# Patient Record
Sex: Male | Born: 1955
Health system: Southern US, Community
[De-identification: ages and names within clinical notes are randomized; demographics above are authoritative.]

## PROBLEM LIST (undated history)

## (undated) DIAGNOSIS — T7840XA Allergy, unspecified, initial encounter: Secondary | ICD-10-CM

## (undated) DIAGNOSIS — I10 Essential (primary) hypertension: Secondary | ICD-10-CM

## (undated) DIAGNOSIS — Z974 Presence of external hearing-aid: Secondary | ICD-10-CM

## (undated) HISTORY — PX: TENDON REPAIR: SHX5111

## (undated) HISTORY — DX: Presence of external hearing-aid: Z97.4

## (undated) HISTORY — DX: Essential (primary) hypertension: I10

## (undated) HISTORY — DX: Allergy, unspecified, initial encounter: T78.40XA

## (undated) HISTORY — PX: KNEE SURGERY: SHX244

---

## 2005-01-16 ENCOUNTER — Emergency Department (HOSPITAL_COMMUNITY): Admission: EM | Admit: 2005-01-16 | Discharge: 2005-01-16 | Payer: Self-pay | Admitting: Emergency Medicine

## 2014-04-01 ENCOUNTER — Encounter (HOSPITAL_COMMUNITY): Payer: Self-pay | Admitting: Emergency Medicine

## 2014-04-01 ENCOUNTER — Emergency Department (HOSPITAL_COMMUNITY): Payer: Worker's Compensation

## 2014-04-01 ENCOUNTER — Emergency Department (HOSPITAL_COMMUNITY)
Admission: EM | Admit: 2014-04-01 | Discharge: 2014-04-01 | Disposition: A | Discharge status: Home / Self Care | Payer: Worker's Compensation | Attending: Emergency Medicine | Admitting: Emergency Medicine

## 2014-04-01 DIAGNOSIS — Y9289 Other specified places as the place of occurrence of the external cause: Secondary | ICD-10-CM | POA: Diagnosis not present

## 2014-04-01 DIAGNOSIS — S99919A Unspecified injury of unspecified ankle, initial encounter: Secondary | ICD-10-CM | POA: Diagnosis present

## 2014-04-01 DIAGNOSIS — S99929A Unspecified injury of unspecified foot, initial encounter: Secondary | ICD-10-CM

## 2014-04-01 DIAGNOSIS — Y939 Activity, unspecified: Secondary | ICD-10-CM | POA: Insufficient documentation

## 2014-04-01 DIAGNOSIS — S9031XA Contusion of right foot, initial encounter: Secondary | ICD-10-CM

## 2014-04-01 DIAGNOSIS — W230XXA Caught, crushed, jammed, or pinched between moving objects, initial encounter: Secondary | ICD-10-CM | POA: Diagnosis not present

## 2014-04-01 DIAGNOSIS — Y990 Civilian activity done for income or pay: Secondary | ICD-10-CM | POA: Diagnosis not present

## 2014-04-01 DIAGNOSIS — S9030XA Contusion of unspecified foot, initial encounter: Secondary | ICD-10-CM | POA: Diagnosis not present

## 2014-04-01 DIAGNOSIS — S8990XA Unspecified injury of unspecified lower leg, initial encounter: Secondary | ICD-10-CM | POA: Insufficient documentation

## 2014-04-01 MED ORDER — OXYCODONE-ACETAMINOPHEN 5-325 MG PO TABS: 1.0000 | ORAL_TABLET | ORAL | Status: DC | PRN

## 2014-04-01 MED ORDER — OXYCODONE-ACETAMINOPHEN 5-325 MG PO TABS
1.0000 | ORAL_TABLET | Freq: Once | ORAL | Status: AC
  Administered 2014-04-01: 1 via ORAL
  Filled 2014-04-01: qty 1

## 2014-04-01 NOTE — ED Provider Notes (Signed)
Medical screening examination/treatment/procedure(s) were performed by non-physician practitioner and as supervising physician I was immediately available for consultation/collaboration.   EKG Interpretation None       Orlie Dakin, MD 04/01/14 1625

## 2014-04-01 NOTE — ED Provider Notes (Signed)
CSN: [id number]     Arrival date & time 04/01/14  1257 History  This chart was scribed for a non-physician practitioner, Nehemiah Settle A. Henrietta Hoover, working with Orlie Dakin, MD by Martinique Peace, ED Scribe. The patient was seen in [facility]. The patient's care was started at 1:20 PM.    Chief Complaint  Patient presents with  . Foot Injury      Patient is a 58 y.o. male presenting with foot injury. The history is provided by the patient. No language interpreter was used.  Foot Injury Location:  Foot Time since incident:  1 hour Injury: yes   Mechanism of injury comment:  Forklift accident Foot location:  R foot Pain details:    Severity:  Severe   Onset quality:  Sudden Chronicity:  New Dislocation: no   Foreign body present:  No foreign bodies Prior injury to area:  Yes Relieved by:  Nothing Ineffective treatments:  None tried Associated symptoms: numbness and swelling   Associated symptoms: no decreased ROM, no fever, no muscle weakness and no stiffness   Risk factors: no obesity and no recent illness    HPI Comments: Kenneth Snyder is a 58 y.o. male who presents to the Emergency Department complaining of right foot injury onset 1 hr ago that occurred while he was at work when his foot got trapped between two pallets that weight around 2000 lbs each due to his coworker's poor control of forklift. Pt reports that he was wearing boots when it happened. Pt complains of associated swelling and numbness to affected area. He denies ankle injury or any other injury during incident. Pt further denies DM, being on coumadin or any other problematic health problems.    History reviewed. No pertinent past medical history. Past Surgical History  Procedure Laterality Date  . Tendon repair Left arm   No family history on file. History  Substance Use Topics  . Smoking status: Never Smoker   . Smokeless tobacco: Not on file  . Alcohol Use: Yes    Review of Systems  Constitutional:  Negative for fever and chills.  Gastrointestinal: Negative for nausea, vomiting and diarrhea.  Musculoskeletal: Negative for stiffness.       Right foot pain along proximal and lateral forefoot.   Skin: Negative for color change and wound.  All other systems reviewed and are negative.     Allergies  Review of patient's allergies indicates no known allergies.  Home Medications   Prior to Admission medications   Not on File   BP 135/82  Pulse 77  Temp(Src) 98.1 F (36.7 C) (Oral)  Resp 20  SpO2 100% Physical Exam  Nursing note and vitals reviewed. Constitutional: He is oriented to person, place, and time. He appears well-developed and well-nourished. No distress.  HENT:  Head: Normocephalic and atraumatic.  Eyes: Conjunctivae and EOM are normal.  Neck: Neck supple. No tracheal deviation present.  Cardiovascular: Normal rate.   Pulmonary/Chest: Effort normal. No respiratory distress.  Musculoskeletal: Normal range of motion.  Right foot minimal swelling of proximal and lateral fore foot. No bony prominence or discoloration. Non-tender and stable.   Neurological: He is alert and oriented to person, place, and time.  Distal neuro sensitivity is intact.  Skin: Skin is warm and dry.  Psychiatric: He has a normal mood and affect. His behavior is normal.    ED Course  Procedures (including critical care time) Labs Review Labs Reviewed - No data to display  No results found for this  or any previous visit. No results found.  Dg Foot Complete Right  04/01/2014   CLINICAL DATA:  Traumatic injury and fifth metatarsal pain  EXAM: RIGHT FOOT COMPLETE - 3+ VIEW  COMPARISON:  None.  FINDINGS: There is no evidence of fracture or dislocation. There is no evidence of arthropathy or other focal bone abnormality. Soft tissues are unremarkable.  IMPRESSION: No acute abnormality is noted.   Electronically Signed   By: Inez Catalina M.D.   On: 04/01/2014 14:41   Imaging Review No results  found.   EKG Interpretation None     Medications - No data to display  1:25 PM- Treatment plan was discussed with patient who verbalizes understanding and agrees.   MDM   Final diagnoses:  None    1. Contusion right foot  No bony abnormalities on x-ray, supporting diagnosis of contusion injury. FROM all digits without weakness. Post-op boot and crutches provided.  I personally performed the services described in this documentation, which was scribed in my presence. The recorded information has been reviewed and is accurate.     Dewaine Oats, PA-C 04/01/14 1526

## 2014-04-01 NOTE — Discharge Instructions (Signed)
Cryotherapy °Cryotherapy means treatment with cold. Ice or gel packs can be used to reduce both pain and swelling. Ice is the most helpful within the first 24 to 48 hours after an injury or flare-up from overusing a muscle or joint. Sprains, strains, spasms, burning pain, shooting pain, and aches can all be eased with ice. Ice can also be used when recovering from surgery. Ice is effective, has very few side effects, and is safe for most people to use. °PRECAUTIONS  °Ice is not a safe treatment option for people with: °· Raynaud phenomenon. This is a condition affecting small blood vessels in the extremities. Exposure to cold may cause your problems to return. °· Cold hypersensitivity. There are many forms of cold hypersensitivity, including: °¨ Cold urticaria. Red, itchy hives appear on the skin when the tissues begin to warm after being iced. °¨ Cold erythema. This is a red, itchy rash caused by exposure to cold. °¨ Cold hemoglobinuria. Red blood cells break down when the tissues begin to warm after being iced. The hemoglobin that carry oxygen are passed into the urine because they cannot combine with blood proteins fast enough. °· Numbness or altered sensitivity in the area being iced. °If you have any of the following conditions, do not use ice until you have discussed cryotherapy with your caregiver: °· Heart conditions, such as arrhythmia, angina, or chronic heart disease. °· High blood pressure. °· Healing wounds or open skin in the area being iced. °· Current infections. °· Rheumatoid arthritis. °· Poor circulation. °· Diabetes. °Ice slows the blood flow in the region it is applied. This is beneficial when trying to stop inflamed tissues from spreading irritating chemicals to surrounding tissues. However, if you expose your skin to cold temperatures for too long or without the proper protection, you can damage your skin or nerves. Watch for signs of skin damage due to cold. °HOME CARE INSTRUCTIONS °Follow  these tips to use ice and cold packs safely. °· Place a dry or damp towel between the ice and skin. A damp towel will cool the skin more quickly, so you may need to shorten the time that the ice is used. °· For a more rapid response, add gentle compression to the ice. °· Ice for no more than 10 to 20 minutes at a time. The bonier the area you are icing, the less time it will take to get the benefits of ice. °· Check your skin after 5 minutes to make sure there are no signs of a poor response to cold or skin damage. °· Rest 20 minutes or more between uses. °· Once your skin is numb, you can end your treatment. You can test numbness by very lightly touching your skin. The touch should be so light that you do not see the skin dimple from the pressure of your fingertip. When using ice, most people will feel these normal sensations in this order: cold, burning, aching, and numbness. °· Do not use ice on someone who cannot communicate their responses to pain, such as small children or people with dementia. °HOW TO MAKE AN ICE PACK °Ice packs are the most common way to use ice therapy. Other methods include ice massage, ice baths, and cryosprays. Muscle creams that cause a cold, tingly feeling do not offer the same benefits that ice offers and should not be used as a substitute unless recommended by your caregiver. °To make an ice pack, do one of the following: °· Place crushed ice or a   bag of frozen vegetables in a sealable plastic bag. Squeeze out the excess air. Place this bag inside another plastic bag. Slide the bag into a pillowcase or place a damp towel between your skin and the bag.  Mix 3 parts water with 1 part rubbing alcohol. Freeze the mixture in a sealable plastic bag. When you remove the mixture from the freezer, it will be slushy. Squeeze out the excess air. Place this bag inside another plastic bag. Slide the bag into a pillowcase or place a damp towel between your skin and the bag. SEEK MEDICAL CARE  IF:  You develop white spots on your skin. This may give the skin a blotchy (mottled) appearance.  Your skin turns blue or pale.  Your skin becomes waxy or hard.  Your swelling gets worse. MAKE SURE YOU:   Understand these instructions.  Will watch your condition.  Will get help right away if you are not doing well or get worse. Document Released: 03/17/2011 Document Revised: 12/05/2013 Document Reviewed: 03/17/2011 [facility] Patient Information 2015 [place], [place]. This information is not intended to replace advice given to you by your health care provider. Make sure you discuss any questions you have with your health care provider.  Contusion A contusion is a deep bruise. Contusions happen when an injury causes bleeding under the skin. Signs of bruising include pain, puffiness (swelling), and discolored skin. The contusion may turn blue, purple, or yellow. HOME CARE   Put ice on the injured area.  Put ice in a plastic bag.  Place a towel between your skin and the bag.  Leave the ice on for 15-20 minutes, 03-04 times a day.  Only take medicine as told by your doctor.  Rest the injured area.  If possible, raise (elevate) the injured area to lessen puffiness. GET HELP RIGHT AWAY IF:   You have more bruising or puffiness.  You have pain that is getting worse.  Your puffiness or pain is not helped by medicine. MAKE SURE YOU:   Understand these instructions.  Will watch your condition.  Will get help right away if you are not doing well or get worse. Document Released: 01/07/2008 Document Revised: 10/13/2011 Document Reviewed: 05/26/2011 [facility] Patient Information 2015 [place], [place]. This information is not intended to replace advice given to you by your health care provider. Make sure you discuss any questions you have with your health care provider.

## 2014-04-01 NOTE — ED Notes (Addendum)
Pt from home c/o R foot pain after it was trapped between two pallets that weigh approx 2000 lbs.each approx 1 hr PTA. Pt denies ankle pain. Pt has swelling to top of R foot. Pt denies other injury. Pt is A&O and in NAD. Pt wife at bedside. Pt can wiggle toes, cap refill <3, moderate pulse palpated  And has good color. Pt moves ankle w/o difficulty or pain

## 2015-08-29 ENCOUNTER — Encounter (HOSPITAL_COMMUNITY): Payer: Self-pay

## 2015-08-29 ENCOUNTER — Emergency Department (HOSPITAL_COMMUNITY)
Admission: EM | Admit: 2015-08-29 | Discharge: 2015-08-29 | Disposition: A | Discharge status: Home / Self Care | Payer: Worker's Compensation | Attending: Emergency Medicine | Admitting: Emergency Medicine

## 2015-08-29 ENCOUNTER — Emergency Department (HOSPITAL_COMMUNITY): Payer: Worker's Compensation

## 2015-08-29 DIAGNOSIS — Z7951 Long term (current) use of inhaled steroids: Secondary | ICD-10-CM | POA: Diagnosis not present

## 2015-08-29 DIAGNOSIS — Y9389 Activity, other specified: Secondary | ICD-10-CM | POA: Diagnosis not present

## 2015-08-29 DIAGNOSIS — S0990XA Unspecified injury of head, initial encounter: Secondary | ICD-10-CM | POA: Diagnosis present

## 2015-08-29 DIAGNOSIS — S060X1A Concussion with loss of consciousness of 30 minutes or less, initial encounter: Secondary | ICD-10-CM | POA: Insufficient documentation

## 2015-08-29 DIAGNOSIS — Y990 Civilian activity done for income or pay: Secondary | ICD-10-CM | POA: Diagnosis not present

## 2015-08-29 DIAGNOSIS — R55 Syncope and collapse: Secondary | ICD-10-CM | POA: Diagnosis not present

## 2015-08-29 DIAGNOSIS — Y9289 Other specified places as the place of occurrence of the external cause: Secondary | ICD-10-CM | POA: Diagnosis not present

## 2015-08-29 DIAGNOSIS — W1809XA Striking against other object with subsequent fall, initial encounter: Secondary | ICD-10-CM | POA: Insufficient documentation

## 2015-08-29 NOTE — ED Notes (Signed)
ED PA at bedside

## 2015-08-29 NOTE — Discharge Instructions (Signed)
Concussion, Adult A concussion, or closed-head injury, is a brain injury caused by a direct blow to the head or by a quick and sudden movement (jolt) of the head or neck. Concussions are usually not life-threatening. Even so, the effects of a concussion can be serious. If you have had a concussion before, you are more likely to experience concussion-like symptoms after a direct blow to the head.  CAUSES  Direct blow to the head, such as from running into another player during a soccer game, being hit in a fight, or hitting your head on a hard surface.  A jolt of the head or neck that causes the brain to move back and forth inside the skull, such as in a car crash. SIGNS AND SYMPTOMS The signs of a concussion can be hard to notice. Early on, they may be missed by you, family members, and health care providers. You may look fine but act or feel differently. Symptoms are usually temporary, but they may last for days, weeks, or even longer. Some symptoms may appear right away while others may not show up for hours or days. Every head injury is different. Symptoms include:  Mild to moderate headaches that will not go away.  A feeling of pressure inside your head.  Having more trouble than usual:  Learning or remembering things you have heard.  Answering questions.  Paying attention or concentrating.  Organizing daily tasks.  Making decisions and solving problems.  Slowness in thinking, acting or reacting, speaking, or reading.  Getting lost or being easily confused.  Feeling tired all the time or lacking energy (fatigued).  Feeling drowsy.  Sleep disturbances.  Sleeping more than usual.  Sleeping less than usual.  Trouble falling asleep.  Trouble sleeping (insomnia).  Loss of balance or feeling lightheaded or dizzy.  Nausea or vomiting.  Numbness or tingling.  Increased sensitivity to:  Sounds.  Lights.  Distractions.  Vision problems or eyes that tire  easily.  Diminished sense of taste or smell.  Ringing in the ears.  Mood changes such as feeling sad or anxious.  Becoming easily irritated or angry for little or no reason.  Lack of motivation.  Seeing or hearing things other people do not see or hear (hallucinations). DIAGNOSIS Your health care provider can usually diagnose a concussion based on a description of your injury and symptoms. He or she will ask whether you passed out (lost consciousness) and whether you are having trouble remembering events that happened right before and during your injury. Your evaluation might include:  A brain scan to look for signs of injury to the brain. Even if the test shows no injury, you may still have a concussion.  Blood tests to be sure other problems are not present. TREATMENT  Concussions are usually treated in an emergency department, in urgent care, or at a clinic. You may need to stay in the hospital overnight for further treatment.  Tell your health care provider if you are taking any medicines, including prescription medicines, over-the-counter medicines, and natural remedies. Some medicines, such as blood thinners (anticoagulants) and aspirin, may increase the chance of complications. Also tell your health care provider whether you have had alcohol or are taking illegal drugs. This information may affect treatment.  Your health care provider will send you home with important instructions to follow.  How fast you will recover from a concussion depends on many factors. These factors include how severe your concussion is, what part of your brain was injured,  your age, and how healthy you were before the concussion.  Most people with mild injuries recover fully. Recovery can take time. In general, recovery is slower in older persons. Also, persons who have had a concussion in the past or have other medical problems may find that it takes longer to recover from their current injury. HOME  CARE INSTRUCTIONS General Instructions  Carefully follow the directions your health care provider gave you.  Only take over-the-counter or prescription medicines for pain, discomfort, or fever as directed by your health care provider.  Take only those medicines that your health care provider has approved.  Do not drink alcohol until your health care provider says you are well enough to do so. Alcohol and certain other drugs may slow your recovery and can put you at risk of further injury.  If it is harder than usual to remember things, write them down.  If you are easily distracted, try to do one thing at a time. For example, do not try to watch TV while fixing dinner.  Talk with family members or close friends when making important decisions.  Keep all follow-up appointments. Repeated evaluation of your symptoms is recommended for your recovery.  Watch your symptoms and tell others to do the same. Complications sometimes occur after a concussion. Older adults with a brain injury may have a higher risk of serious complications, such as a blood clot on the brain.  Tell your teachers, school nurse, school counselor, coach, athletic trainer, or work Freight forwarder about your injury, symptoms, and restrictions. Tell them about what you can or cannot do. They should watch for:  Increased problems with attention or concentration.  Increased difficulty remembering or learning new information.  Increased time needed to complete tasks or assignments.  Increased irritability or decreased ability to cope with stress.  Increased symptoms.  Rest. Rest helps the brain to heal. Make sure you:  Get plenty of sleep at night. Avoid staying up late at night.  Keep the same bedtime hours on weekends and weekdays.  Rest during the day. Take daytime naps or rest breaks when you feel tired.  Limit activities that require a lot of thought or concentration. These include:  Doing homework or job-related  work.  Watching TV.  Working on the computer.  Avoid any situation where there is potential for another head injury (football, hockey, soccer, basketball, martial arts, downhill snow sports and horseback riding). Your condition will get worse every time you experience a concussion. You should avoid these activities until you are evaluated by the appropriate follow-up health care providers. Returning To Your Regular Activities You will need to return to your normal activities slowly, not all at once. You must give your body and brain enough time for recovery.  Do not return to sports or other athletic activities until your health care provider tells you it is safe to do so.  Ask your health care provider when you can drive, ride a bicycle, or operate heavy machinery. Your ability to react may be slower after a brain injury. Never do these activities if you are dizzy.  Ask your health care provider about when you can return to work or school. Preventing Another Concussion It is very important to avoid another brain injury, especially before you have recovered. In rare cases, another injury can lead to permanent brain damage, brain swelling, or death. The risk of this is greatest during the first 7-10 days after a head injury. Avoid injuries by:  Wearing a  seat belt when riding in a car.  Drinking alcohol only in moderation.  Wearing a helmet when biking, skiing, skateboarding, skating, or doing similar activities.  Avoiding activities that could lead to a second concussion, such as contact or recreational sports, until your health care provider says it is okay.  Taking safety measures in your home.  Remove clutter and tripping hazards from floors and stairways.  Use grab bars in bathrooms and handrails by stairs.  Place non-slip mats on floors and in bathtubs.  Improve lighting in dim areas. SEEK MEDICAL CARE IF:  You have increased problems paying attention or  concentrating.  You have increased difficulty remembering or learning new information.  You need more time to complete tasks or assignments than before.  You have increased irritability or decreased ability to cope with stress.  You have more symptoms than before. Seek medical care if you have any of the following symptoms for more than 2 weeks after your injury:  Lasting (chronic) headaches.  Dizziness or balance problems.  Nausea.  Vision problems.  Increased sensitivity to noise or light.  Depression or mood swings.  Anxiety or irritability.  Memory problems.  Difficulty concentrating or paying attention.  Sleep problems.  Feeling tired all the time. SEEK IMMEDIATE MEDICAL CARE IF:  You have severe or worsening headaches. These may be a sign of a blood clot in the brain.  You have weakness (even if only in one hand, leg, or part of the face).  You have numbness.  You have decreased coordination.  You vomit repeatedly.  You have increased sleepiness.  One pupil is larger than the other.  You have convulsions.  You have slurred speech.  You have increased confusion. This may be a sign of a blood clot in the brain.  You have increased restlessness, agitation, or irritability.  You are unable to recognize people or places.  You have neck pain.  It is difficult to wake you up.  You have unusual behavior changes.  You lose consciousness. MAKE SURE YOU:  Understand these instructions.  Will watch your condition.  Will get help right away if you are not doing well or get worse.   This information is not intended to replace advice given to you by your health care provider. Make sure you discuss any questions you have with your health care provider.   Document Released: 10/11/2003 Document Revised: 08/11/2014 Document Reviewed: 02/10/2013 Elsevier Interactive Patient Education 2016 [employer]  Post-Concussion Syndrome Post-concussion  syndrome describes the symptoms that can occur after a head injury. These symptoms can last from weeks to months. CAUSES  It is not clear why some head injuries cause post-concussion syndrome. It can occur whether your head injury was mild or severe and whether you were wearing head protection or not.  SIGNS AND SYMPTOMS  Memory difficulties.  Dizziness.  Headaches.  Double vision or blurry vision.  Sensitivity to light.  Hearing difficulties.  Depression.  Tiredness.  Weakness.  Difficulty with concentration.  Difficulty sleeping or staying asleep.  Vomiting.  Poor balance or instability on your feet.  Slow reaction time.  Difficulty learning and remembering things you have heard. DIAGNOSIS  There is no test to determine whether you have post-concussion syndrome. Your health care provider may order an imaging scan of your brain, such as a CT scan, to check for other problems that may be causing your symptoms (such as a severe injury inside your skull). TREATMENT  Usually, these problems disappear over time  without medical care. Your health care provider may prescribe medicine to help ease your symptoms. It is important to follow up with a neurologist to evaluate your recovery and address any lingering symptoms or issues. HOME CARE INSTRUCTIONS   Take medicines only as directed by your health care provider. Do not take aspirin. Aspirin can slow blood clotting.  Sleep with your head slightly elevated to help with headaches.  Avoid any situation where there is potential for another head injury. This includes football, hockey, soccer, basketball, martial arts, downhill snow sports, and horseback riding. Your condition will get worse every time you experience a concussion. You should avoid these activities until you are evaluated by the appropriate follow-up health care providers.  Keep all follow-up visits as directed by your health care provider. This is important. SEEK  MEDICAL CARE IF:  You have increased problems paying attention or concentrating.  You have increased difficulty remembering or learning new information.  You need more time to complete tasks or assignments than before.  You have increased irritability or decreased ability to cope with stress.  You have more symptoms than before. Seek medical care if you have any of the following symptoms for more than two weeks after your injury:  Lasting (chronic) headaches.  Dizziness or balance problems.  Nausea.  Vision problems.  Increased sensitivity to noise or light.  Depression or mood swings.  Anxiety or irritability.  Memory problems.  Difficulty concentrating or paying attention.  Sleep problems.  Feeling tired all the time. SEEK IMMEDIATE MEDICAL CARE IF:  You have confusion or unusual drowsiness.  Others find it difficult to wake you up.  You have nausea or persistent, forceful vomiting.  You feel like you are moving when you are not (vertigo). Your eyes may move rapidly back and forth.  You have convulsions or faint.  You have severe, persistent headaches that are not relieved by medicine.  You cannot use your arms or legs normally.  One of your pupils is larger than the other.  You have clear or bloody discharge from your nose or ears.  Your problems are getting worse, not better. MAKE SURE YOU:  Understand these instructions.  Will watch your condition.  Will get help right away if you are not doing well or get worse.   This information is not intended to replace advice given to you by your health care provider. Make sure you discuss any questions you have with your health care provider.   Document Released: 01/10/2002 Document Revised: 08/11/2014 Document Reviewed: 10/26/2013 Elsevier Interactive Patient Education [employer].

## 2015-08-29 NOTE — ED Provider Notes (Signed)
CSN: [id number]     Arrival date & time 08/29/15  1227 History   First MD Initiated Contact with Patient 08/29/15 1505     Chief Complaint  Patient presents with  . Head Injury     (Consider location/radiation/quality/duration/timing/severity/associated sxs/prior Treatment) HPI Comments: Patient presents to the ED with a chief complaint of head injury.  Patient states that he hit his head on a steel beam today at work.  He hit the back of his head. States that it caused him to pass out and he fell to the the ground.  He reports associated nausea, but no vomiting.  States that he had tingling throughout his entire body, but not in any particular region.  He states that he has had some difficulty remembering things since the accident and has been asking questions repeatedly.  He states that he has had some difficulty balancing.  He denies vision changes, weakness, or numbness.  He does not take any anticoagulants.  He denies any other injuries.  He states that his symptoms are improving.  The history is provided by the patient. No language interpreter was used.    History reviewed. No pertinent past medical history. Past Surgical History  Procedure Laterality Date  . Tendon repair Left arm   No family history on file. Social History  Substance Use Topics  . Smoking status: Never Smoker   . Smokeless tobacco: None  . Alcohol Use: Yes    Review of Systems  Constitutional: Negative for fever and chills.  Respiratory: Negative for shortness of breath.   Cardiovascular: Negative for chest pain.  Gastrointestinal: Negative for nausea, vomiting, diarrhea and constipation.  Genitourinary: Negative for dysuria.  Neurological: Positive for syncope and headaches.  All other systems reviewed and are negative.     Allergies  Review of patient's allergies indicates no known allergies.  Home Medications   Prior to Admission medications   Medication Sig Start Date End Date Taking?  Authorizing Provider  budesonide (RHINOCORT ALLERGY) 32 MCG/ACT nasal spray Place 2 sprays into both nostrils daily as needed for rhinitis or allergies.   Yes Historical Provider, MD  ranitidine (ZANTAC) 150 MG tablet Take 150 mg by mouth 2 (two) times daily as needed for heartburn.   Yes Historical Provider, MD  fexofenadine (ALLEGRA) 180 MG tablet Take 180 mg by mouth daily as needed for allergies.     Historical Provider, MD  oxyCODONE-acetaminophen (PERCOCET/ROXICET) 5-325 MG per tablet Take 1 tablet by mouth every 4 (four) hours as needed for severe pain. Patient not taking: Reported on 08/29/2015 04/01/14   Charlann Lange, PA-C   BP 148/93 mmHg  Pulse 66  Temp(Src) 97.8 F (36.6 C) (Oral)  Resp 16  SpO2 99% Physical Exam  Constitutional: He is oriented to person, place, and time. He appears well-developed and well-nourished.  HENT:  Head: Normocephalic and atraumatic.  Right Ear: External ear normal.  Left Ear: External ear normal.  Eyes: Conjunctivae and EOM are normal. Pupils are equal, round, and reactive to light.  Neck: Normal range of motion. Neck supple.  No pain with neck flexion, no meningismus  Cardiovascular: Normal rate, regular rhythm and normal heart sounds.  Exam reveals no gallop and no friction rub.   No murmur heard. Pulmonary/Chest: Effort normal and breath sounds normal. No respiratory distress. He has no wheezes. He has no rales. He exhibits no tenderness.  Abdominal: Soft. He exhibits no distension and no mass. There is no tenderness. There is no rebound and no  guarding.  Musculoskeletal: Normal range of motion. He exhibits no edema or tenderness.  Normal gait.  Neurological: He is alert and oriented to person, place, and time. He has normal reflexes.  CN 3-12 intact, normal finger to nose, no pronator drift, sensation and strength intact bilaterally.  Skin: Skin is warm and dry.  Psychiatric: He has a normal mood and affect. His behavior is normal. Judgment  and thought content normal.  Nursing note and vitals reviewed.   ED Course  Procedures (including critical care time)  Imaging Review Ct Head Wo Contrast  08/29/2015  CLINICAL DATA:  Recent blunt trauma to the head with apparent loss of consciousness EXAM: CT HEAD WITHOUT CONTRAST TECHNIQUE: Contiguous axial images were obtained from the base of the skull through the vertex without intravenous contrast. COMPARISON:  None. FINDINGS: The bony calvarium is intact. No findings to suggest acute hemorrhage, acute infarction or space-occupying mass lesion are noted. IMPRESSION: No acute abnormality noted. Electronically Signed   By: Inez Catalina M.D.   On: 08/29/2015 15:43   I have personally reviewed and evaluated these images and lab results as part of my medical decision-making.   MDM   Final diagnoses:  Concussion, with loss of consciousness of 30 minutes or less, initial encounter    Patient with head injury.  Suspected LOC.  No vomiting.  Will check head CT.  Likely concussion.  Neurovascularly intact.  4:19 PM CT is negative.  Patient continues to feel better.  Recommend close follow-up with PCP or neurology for concussion.  Patient drives truck and fork lift.  Advised work restrictions until seen in follow-up.    Eldridge Rielly, PA-C 08/29/15 [place], MD 08/30/15 [phone]

## 2015-08-29 NOTE — ED Notes (Signed)
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  He states he feels nauseated, but hasn't vomited.  He further states he is having balance difficulty and is having trouble ambulating.  He states he was struck at right upper occipital area.

## 2015-09-05 ENCOUNTER — Ambulatory Visit (INDEPENDENT_AMBULATORY_CARE_PROVIDER_SITE_OTHER): Payer: Worker's Compensation | Admitting: Family Medicine

## 2015-09-05 VITALS — BP 128/80 | HR 70 | Temp 97.7°F | Resp 19 | Ht 70.5 in | Wt 236.4 lb

## 2015-09-05 DIAGNOSIS — G44309 Post-traumatic headache, unspecified, not intractable: Secondary | ICD-10-CM

## 2015-09-05 DIAGNOSIS — R2689 Other abnormalities of gait and mobility: Secondary | ICD-10-CM

## 2015-09-05 DIAGNOSIS — S0990XS Unspecified injury of head, sequela: Secondary | ICD-10-CM | POA: Diagnosis not present

## 2015-09-05 DIAGNOSIS — R2681 Unsteadiness on feet: Secondary | ICD-10-CM

## 2015-09-05 DIAGNOSIS — S060X9A Concussion with loss of consciousness of unspecified duration, initial encounter: Secondary | ICD-10-CM

## 2015-09-05 NOTE — Progress Notes (Signed)
Patient ID: SUHEB BERMUDES, male    DOB: [birth year]  Age: 60 y.o. MRN: [id number]  Chief Complaint  Patient presents with  . Headache    W/C  . Concussion    Subjective:   60 year old man who had an accident at work on January 25. He picked up and slowing of 50 pound bag of fertilizer, and as he rotated his body his head struck a steel bar. He was transiently knocked out, probably just a few seconds and got himself up in a very staggered fashion.    A coworker came and helped him.they called [facility] and had a CT scan of his head and was diagnosed with having had a concussion. He has been off of work, doing very little, minimizing brain use and screen use.He was a little unsteady in his gait, but that has steadily improved He feels like he is almost but not quite back to normal. When he stands still for a while his balance is not quite as good as it should be. His gait is good.  Current allergies, medications, problem list, past/family and social histories reviewed.  Objective:  BP 128/80 mmHg  Pulse 70  Temp(Src) 97.7 F (36.5 C) (Oral)  Resp 19  Ht 5' 10.5" (1.791 m)  Wt 236 lb 6.4 oz (107.23 kg)  BMI 33.43 kg/m2  SpO2 97%  Alert and oriented in no acu distress. His TMs are normal. Eyes PERRLA. EOMs intact. Fundi benign. Has a dark spot in the left lens, sees an eye doctor. His neck is supple without nodes. Chest clear. Heart regular without murmur. Cranial nerves II-12 grossly intact.Strength symmetrical. Romberg is a little unsteady.Finger-nose fair but not extremely rapid Right better than left.Tandem walk was satisfactory.  Assessment & Plan:   Assessment: No diagnosis found.    Plan: Off work for another week to reassess. Continue to not over strain his brain.  No orders of the defined types were placed in this encounter.    No orders of the defined types were placed in this encounter.          There are no Patient Instructions on file for this visit.   No Follow-up on file.   HOPPER,DAVID, MD 09/05/2015

## 2015-09-05 NOTE — Patient Instructions (Addendum)
Continue light activity only  Continue minimizing screen use  Return at any time if abruptly worse  Return in one week  Concussion, Adult A concussion, or closed-head injury, is a brain injury caused by a direct blow to the head or by a quick and sudden movement (jolt) of the head or neck. Concussions are usually not life-threatening. Even so, the effects of a concussion can be serious. If you have had a concussion before, you are more likely to experience concussion-like symptoms after a direct blow to the head.  CAUSES  Direct blow to the head, such as from running into another player during a soccer game, being hit in a fight, or hitting your head on a hard surface.  A jolt of the head or neck that causes the brain to move back and forth inside the skull, such as in a car crash. SIGNS AND SYMPTOMS The signs of a concussion can be hard to notice. Early on, they may be missed by you, family members, and health care providers. You may look fine but act or feel differently. Symptoms are usually temporary, but they may last for days, weeks, or even longer. Some symptoms may appear right away while others may not show up for hours or days. Every head injury is different. Symptoms include:  Mild to moderate headaches that will not go away.  A feeling of pressure inside your head.  Having more trouble than usual:  Learning or remembering things you have heard.  Answering questions.  Paying attention or concentrating.  Organizing daily tasks.  Making decisions and solving problems.  Slowness in thinking, acting or reacting, speaking, or reading.  Getting lost or being easily confused.  Feeling tired all the time or lacking energy (fatigued).  Feeling drowsy.  Sleep disturbances.  Sleeping more than usual.  Sleeping less than usual.  Trouble falling asleep.  Trouble sleeping (insomnia).  Loss of balance or feeling lightheaded or dizzy.  Nausea or vomiting.  Numbness  or tingling.  Increased sensitivity to:  Sounds.  Lights.  Distractions.  Vision problems or eyes that tire easily.  Diminished sense of taste or smell.  Ringing in the ears.  Mood changes such as feeling sad or anxious.  Becoming easily irritated or angry for little or no reason.  Lack of motivation.  Seeing or hearing things other people do not see or hear (hallucinations). DIAGNOSIS Your health care provider can usually diagnose a concussion based on a description of your injury and symptoms. He or she will ask whether you passed out (lost consciousness) and whether you are having trouble remembering events that happened right before and during your injury. Your evaluation might include:  A brain scan to look for signs of injury to the brain. Even if the test shows no injury, you may still have a concussion.  Blood tests to be sure other problems are not present. TREATMENT  Concussions are usually treated in an emergency department, in urgent care, or at a clinic. You may need to stay in the hospital overnight for further treatment.  Tell your health care provider if you are taking any medicines, including prescription medicines, over-the-counter medicines, and natural remedies. Some medicines, such as blood thinners (anticoagulants) and aspirin, may increase the chance of complications. Also tell your health care provider whether you have had alcohol or are taking illegal drugs. This information may affect treatment.  Your health care provider will send you home with important instructions to follow.  How fast you will  recover from a concussion depends on many factors. These factors include how severe your concussion is, what part of your brain was injured, your age, and how healthy you were before the concussion.  Most people with mild injuries recover fully. Recovery can take time. In general, recovery is slower in older persons. Also, persons who have had a concussion  in the past or have other medical problems may find that it takes longer to recover from their current injury. HOME CARE INSTRUCTIONS General Instructions  Carefully follow the directions your health care provider gave you.  Only take over-the-counter or prescription medicines for pain, discomfort, or fever as directed by your health care provider.  Take only those medicines that your health care provider has approved.  Do not drink alcohol until your health care provider says you are well enough to do so. Alcohol and certain other drugs may slow your recovery and can put you at risk of further injury.  If it is harder than usual to remember things, write them down.  If you are easily distracted, try to do one thing at a time. For example, do not try to watch TV while fixing dinner.  Talk with family members or close friends when making important decisions.  Keep all follow-up appointments. Repeated evaluation of your symptoms is recommended for your recovery.  Watch your symptoms and tell others to do the same. Complications sometimes occur after a concussion. Older adults with a brain injury may have a higher risk of serious complications, such as a blood clot on the brain.  Tell your teachers, school nurse, school counselor, coach, athletic trainer, or work Freight forwarder about your injury, symptoms, and restrictions. Tell them about what you can or cannot do. They should watch for:  Increased problems with attention or concentration.  Increased difficulty remembering or learning new information.  Increased time needed to complete tasks or assignments.  Increased irritability or decreased ability to cope with stress.  Increased symptoms.  Rest. Rest helps the brain to heal. Make sure you:  Get plenty of sleep at night. Avoid staying up late at night.  Keep the same bedtime hours on weekends and weekdays.  Rest during the day. Take daytime naps or rest breaks when you feel  tired.  Limit activities that require a lot of thought or concentration. These include:  Doing homework or job-related work.  Watching TV.  Working on the computer.  Avoid any situation where there is potential for another head injury (football, hockey, soccer, basketball, martial arts, downhill snow sports and horseback riding). Your condition will get worse every time you experience a concussion. You should avoid these activities until you are evaluated by the appropriate follow-up health care providers. Returning To Your Regular Activities You will need to return to your normal activities slowly, not all at once. You must give your body and brain enough time for recovery.  Do not return to sports or other athletic activities until your health care provider tells you it is safe to do so.  Ask your health care provider when you can drive, ride a bicycle, or operate heavy machinery. Your ability to react may be slower after a brain injury. Never do these activities if you are dizzy.  Ask your health care provider about when you can return to work or school. Preventing Another Concussion It is very important to avoid another brain injury, especially before you have recovered. In rare cases, another injury can lead to permanent brain damage, brain swelling,  or death. The risk of this is greatest during the first 7-10 days after a head injury. Avoid injuries by:  Wearing a seat belt when riding in a car.  Drinking alcohol only in moderation.  Wearing a helmet when biking, skiing, skateboarding, skating, or doing similar activities.  Avoiding activities that could lead to a second concussion, such as contact or recreational sports, until your health care provider says it is okay.  Taking safety measures in your home.  Remove clutter and tripping hazards from floors and stairways.  Use grab bars in bathrooms and handrails by stairs.  Place non-slip mats on floors and in  bathtubs.  Improve lighting in dim areas. SEEK MEDICAL CARE IF:  You have increased problems paying attention or concentrating.  You have increased difficulty remembering or learning new information.  You need more time to complete tasks or assignments than before.  You have increased irritability or decreased ability to cope with stress.  You have more symptoms than before. Seek medical care if you have any of the following symptoms for more than 2 weeks after your injury:  Lasting (chronic) headaches.  Dizziness or balance problems.  Nausea.  Vision problems.  Increased sensitivity to noise or light.  Depression or mood swings.  Anxiety or irritability.  Memory problems.  Difficulty concentrating or paying attention.  Sleep problems.  Feeling tired all the time. SEEK IMMEDIATE MEDICAL CARE IF:  You have severe or worsening headaches. These may be a sign of a blood clot in the brain.  You have weakness (even if only in one hand, leg, or part of the face).  You have numbness.  You have decreased coordination.  You vomit repeatedly.  You have increased sleepiness.  One pupil is larger than the other.  You have convulsions.  You have slurred speech.  You have increased confusion. This may be a sign of a blood clot in the brain.  You have increased restlessness, agitation, or irritability.  You are unable to recognize people or places.  You have neck pain.  It is difficult to wake you up.  You have unusual behavior changes.  You lose consciousness. MAKE SURE YOU:  Understand these instructions.  Will watch your condition.  Will get help right away if you are not doing well or get worse.   This information is not intended to replace advice given to you by your health care provider. Make sure you discuss any questions you have with your health care provider.   Document Released: 10/11/2003 Document Revised: 08/11/2014 Document Reviewed:  02/10/2013 Elsevier Interactive Patient Education [employer].

## 2015-09-13 ENCOUNTER — Ambulatory Visit (INDEPENDENT_AMBULATORY_CARE_PROVIDER_SITE_OTHER): Payer: Worker's Compensation | Admitting: Family Medicine

## 2015-09-13 VITALS — BP 132/70 | HR 85 | Temp 97.9°F | Resp 20 | Ht 70.5 in | Wt 237.4 lb

## 2015-09-13 DIAGNOSIS — S060X9A Concussion with loss of consciousness of unspecified duration, initial encounter: Secondary | ICD-10-CM

## 2015-09-13 DIAGNOSIS — G44309 Post-traumatic headache, unspecified, not intractable: Secondary | ICD-10-CM

## 2015-09-13 NOTE — Patient Instructions (Signed)
I'm hoping to get you back to work on Monday. Please come back on Monday morning so we can recheck her you're doing.  Concussion, Adult A concussion, or closed-head injury, is a brain injury caused by a direct blow to the head or by a quick and sudden movement (jolt) of the head or neck. Concussions are usually not life-threatening. Even so, the effects of a concussion can be serious. If you have had a concussion before, you are more likely to experience concussion-like symptoms after a direct blow to the head.  CAUSES  Direct blow to the head, such as from running into another player during a soccer game, being hit in a fight, or hitting your head on a hard surface.  A jolt of the head or neck that causes the brain to move back and forth inside the skull, such as in a car crash. SIGNS AND SYMPTOMS The signs of a concussion can be hard to notice. Early on, they may be missed by you, family members, and health care providers. You may look fine but act or feel differently. Symptoms are usually temporary, but they may last for days, weeks, or even longer. Some symptoms may appear right away while others may not show up for hours or days. Every head injury is different. Symptoms include:  Mild to moderate headaches that will not go away.  A feeling of pressure inside your head.  Having more trouble than usual:  Learning or remembering things you have heard.  Answering questions.  Paying attention or concentrating.  Organizing daily tasks.  Making decisions and solving problems.  Slowness in thinking, acting or reacting, speaking, or reading.  Getting lost or being easily confused.  Feeling tired all the time or lacking energy (fatigued).  Feeling drowsy.  Sleep disturbances.  Sleeping more than usual.  Sleeping less than usual.  Trouble falling asleep.  Trouble sleeping (insomnia).  Loss of balance or feeling lightheaded or dizzy.  Nausea or vomiting.  Numbness or  tingling.  Increased sensitivity to:  Sounds.  Lights.  Distractions.  Vision problems or eyes that tire easily.  Diminished sense of taste or smell.  Ringing in the ears.  Mood changes such as feeling sad or anxious.  Becoming easily irritated or angry for little or no reason.  Lack of motivation.  Seeing or hearing things other people do not see or hear (hallucinations). DIAGNOSIS Your health care provider can usually diagnose a concussion based on a description of your injury and symptoms. He or she will ask whether you passed out (lost consciousness) and whether you are having trouble remembering events that happened right before and during your injury. Your evaluation might include:  A brain scan to look for signs of injury to the brain. Even if the test shows no injury, you may still have a concussion.  Blood tests to be sure other problems are not present. TREATMENT  Concussions are usually treated in an emergency department, in urgent care, or at a clinic. You may need to stay in the hospital overnight for further treatment.  Tell your health care provider if you are taking any medicines, including prescription medicines, over-the-counter medicines, and natural remedies. Some medicines, such as blood thinners (anticoagulants) and aspirin, may increase the chance of complications. Also tell your health care provider whether you have had alcohol or are taking illegal drugs. This information may affect treatment.  Your health care provider will send you home with important instructions to follow.  How fast you  will recover from a concussion depends on many factors. These factors include how severe your concussion is, what part of your brain was injured, your age, and how healthy you were before the concussion.  Most people with mild injuries recover fully. Recovery can take time. In general, recovery is slower in older persons. Also, persons who have had a concussion in  the past or have other medical problems may find that it takes longer to recover from their current injury. HOME CARE INSTRUCTIONS General Instructions  Carefully follow the directions your health care provider gave you.  Only take over-the-counter or prescription medicines for pain, discomfort, or fever as directed by your health care provider.  Take only those medicines that your health care provider has approved.  Do not drink alcohol until your health care provider says you are well enough to do so. Alcohol and certain other drugs may slow your recovery and can put you at risk of further injury.  If it is harder than usual to remember things, write them down.  If you are easily distracted, try to do one thing at a time. For example, do not try to watch TV while fixing dinner.  Talk with family members or close friends when making important decisions.  Keep all follow-up appointments. Repeated evaluation of your symptoms is recommended for your recovery.  Watch your symptoms and tell others to do the same. Complications sometimes occur after a concussion. Older adults with a brain injury may have a higher risk of serious complications, such as a blood clot on the brain.  Tell your teachers, school nurse, school counselor, coach, athletic trainer, or work Freight forwarder about your injury, symptoms, and restrictions. Tell them about what you can or cannot do. They should watch for:  Increased problems with attention or concentration.  Increased difficulty remembering or learning new information.  Increased time needed to complete tasks or assignments.  Increased irritability or decreased ability to cope with stress.  Increased symptoms.  Rest. Rest helps the brain to heal. Make sure you:  Get plenty of sleep at night. Avoid staying up late at night.  Keep the same bedtime hours on weekends and weekdays.  Rest during the day. Take daytime naps or rest breaks when you feel  tired.  Limit activities that require a lot of thought or concentration. These include:  Doing homework or job-related work.  Watching TV.  Working on the computer.  Avoid any situation where there is potential for another head injury (football, hockey, soccer, basketball, martial arts, downhill snow sports and horseback riding). Your condition will get worse every time you experience a concussion. You should avoid these activities until you are evaluated by the appropriate follow-up health care providers. Returning To Your Regular Activities You will need to return to your normal activities slowly, not all at once. You must give your body and brain enough time for recovery.  Do not return to sports or other athletic activities until your health care provider tells you it is safe to do so.  Ask your health care provider when you can drive, ride a bicycle, or operate heavy machinery. Your ability to react may be slower after a brain injury. Never do these activities if you are dizzy.  Ask your health care provider about when you can return to work or school. Preventing Another Concussion It is very important to avoid another brain injury, especially before you have recovered. In rare cases, another injury can lead to permanent brain damage, brain  swelling, or death. The risk of this is greatest during the first 7-10 days after a head injury. Avoid injuries by:  Wearing a seat belt when riding in a car.  Drinking alcohol only in moderation.  Wearing a helmet when biking, skiing, skateboarding, skating, or doing similar activities.  Avoiding activities that could lead to a second concussion, such as contact or recreational sports, until your health care provider says it is okay.  Taking safety measures in your home.  Remove clutter and tripping hazards from floors and stairways.  Use grab bars in bathrooms and handrails by stairs.  Place non-slip mats on floors and in  bathtubs.  Improve lighting in dim areas. SEEK MEDICAL CARE IF:  You have increased problems paying attention or concentrating.  You have increased difficulty remembering or learning new information.  You need more time to complete tasks or assignments than before.  You have increased irritability or decreased ability to cope with stress.  You have more symptoms than before. Seek medical care if you have any of the following symptoms for more than 2 weeks after your injury:  Lasting (chronic) headaches.  Dizziness or balance problems.  Nausea.  Vision problems.  Increased sensitivity to noise or light.  Depression or mood swings.  Anxiety or irritability.  Memory problems.  Difficulty concentrating or paying attention.  Sleep problems.  Feeling tired all the time. SEEK IMMEDIATE MEDICAL CARE IF:  You have severe or worsening headaches. These may be a sign of a blood clot in the brain.  You have weakness (even if only in one hand, leg, or part of the face).  You have numbness.  You have decreased coordination.  You vomit repeatedly.  You have increased sleepiness.  One pupil is larger than the other.  You have convulsions.  You have slurred speech.  You have increased confusion. This may be a sign of a blood clot in the brain.  You have increased restlessness, agitation, or irritability.  You are unable to recognize people or places.  You have neck pain.  It is difficult to wake you up.  You have unusual behavior changes.  You lose consciousness. MAKE SURE YOU:  Understand these instructions.  Will watch your condition.  Will get help right away if you are not doing well or get worse.   This information is not intended to replace advice given to you by your health care provider. Make sure you discuss any questions you have with your health care provider.   Document Released: 10/11/2003 Document Revised: 08/11/2014 Document Reviewed:  02/10/2013 [employer] Interactive Patient Education [employer].

## 2015-09-13 NOTE — Progress Notes (Signed)
By signing my name below, I, Rawaa Al Rifaie, attest that this documentation has been prepared under the direction and in the presence of Robyn Haber, Section, Medical Scribe. 09/13/2015.  9:29 AM.  Patient ID: Kenneth Snyder MRN: [id number], DOB: 1956-04-25, 60 y.o. Date of Encounter: 09/13/2015  Primary Physician: No primary care provider on file.  Chief Complaint:  Chief Complaint  Patient presents with  . Follow-up    concussion    HPI:  Kenneth Snyder is a 60 y.o. male who presents to [facility] complaining of a follow up on a concussion that occurred 2 week ago. Pt was seen here at the office on 02/01 by Dr. Linna Darner and was diagnosed with concussion.   Pt reports that as he was carrying a pack of fertilizer he hit his head on a rail. He states that he lost consciousness for about 30 seconds, this incident was not witnesses however. He notes that head pain experienced was on the opposite side of the side of injury. Pt indicates that his headache have resolved, and his ability to concentrate has increased, however he indicates that when he reads using his kindle, he is able to concentrated for only about 15 minutes, but after that his eyes start to water and he starts experiencing severe headaches. Pt still have symptoms of light sensitivity, and difficulty with holding his balance. He denies arms weakness.   Pt is a Administrator.    No past medical history on file.   Home Meds: Prior to Admission medications   Medication Sig Start Date End Date Taking? Authorizing Provider  budesonide (RHINOCORT ALLERGY) 32 MCG/ACT nasal spray Place 2 sprays into both nostrils daily as needed for rhinitis or allergies.   Yes Historical Provider, MD  fexofenadine (ALLEGRA) 180 MG tablet Take 180 mg by mouth daily as needed for allergies.    Yes Historical Provider, MD  ranitidine (ZANTAC) 150 MG tablet Take 150 mg by mouth 2 (two) times daily as needed for  heartburn.   Yes Historical Provider, MD  oxyCODONE-acetaminophen (PERCOCET/ROXICET) 5-325 MG per tablet Take 1 tablet by mouth every 4 (four) hours as needed for severe pain. Patient not taking: Reported on 08/29/2015 04/01/14   Charlann Lange, PA-C    Allergies: No Known Allergies  Social History   Social History  . Marital Status: Married    Spouse Name: N/A  . Number of Children: N/A  . Years of Education: N/A   Occupational History  . Not on file.   Social History Main Topics  . Smoking status: Never Smoker   . Smokeless tobacco: Not on file  . Alcohol Use: Yes  . Drug Use: No  . Sexual Activity: Not on file   Other Topics Concern  . Not on file   Social History Narrative     Review of Systems: Constitutional: negative for chills, fever, night sweats, weight changes, or fatigue  HEENT: negative for vision changes, hearing loss, congestion, rhinorrhea, ST, epistaxis, or sinus pressure. Positive for light sensitivity.  Cardiovascular: negative for chest pain or palpitations Respiratory: negative for hemoptysis, wheezing, shortness of breath, or cough Abdominal: negative for abdominal pain, nausea, vomiting, diarrhea, or constipation Dermatological: negative for rash Neurologic: negative for dizziness, or syncope. Positive for headache, gait problems.  Psych: Positive for decrease of concentration.  All other systems reviewed and are otherwise negative with the exception to those above and in the HPI.  Physical Exam: Blood  pressure 132/70, pulse 85, temperature 97.9 F (36.6 C), temperature source Oral, resp. rate 20, height 5' 10.5" (1.791 m), weight 237 lb 6.4 oz (107.684 kg), SpO2 96 %., Body mass index is 33.57 kg/(m^2). General: Well developed, well nourished, in no acute distress. Head: Normocephalic, atraumatic, eyes without discharge, sclera non-icteric, nares are without discharge. Bilateral auditory canals clear, TM's are without perforation, pearly grey and  translucent with reflective cone of light bilaterally. Oral cavity moist, posterior pharynx without exudate, erythema, peritonsillar abscess, or post nasal drip. Fundi is normal, although he does have a cataract in the left eye.   Neck: Supple. No thyromegaly. Full ROM. No lymphadenopathy. Lungs: Clear bilaterally to auscultation without wheezes, rales, or rhonchi. Breathing is unlabored. Heart: RRR with S1 S2. No murmurs, rubs, or gallops appreciated. Msk:  Strength and tone normal for age. Extremities/Skin: Warm and dry. No clubbing or cyanosis. No edema. No rashes or suspicious lesions. Neuro: Alert and oriented X 3. Moves all extremities spontaneously. Gait is normal. CNII-XII grossly in tact. Slightly unstable with his feet together while standing. He is wobbly when does heel to toe gait. Reflexes and strength are normal. Cranial nerves 33-12 are intact.  Psych:  Responds to questions appropriately with a normal affect.   Labs:  ASSESSMENT AND PLAN:  60 y.o. year old male with  postconcussive syndrome. He's definitely improving and I hope to get him back to work next Monday. He's to follow-up with exam and reevaluation on Monday, February 13th  This chart was scribed in my presence and reviewed by me personally.    ICD-9-CM ICD-10-CM   1. Concussion with loss of consciousness, unspecified duration, initial encounter 850.5 S06.0X9A   2. Post-traumatic headache, not intractable, unspecified chronicity pattern 339.20 G44.309     Signed, Robyn Haber, MD 09/13/2015 9:20 AM

## 2015-09-17 ENCOUNTER — Ambulatory Visit (INDEPENDENT_AMBULATORY_CARE_PROVIDER_SITE_OTHER): Payer: Worker's Compensation | Admitting: Family Medicine

## 2015-09-17 VITALS — BP 132/88 | HR 69 | Temp 98.0°F | Resp 19 | Ht 70.47 in | Wt 243.2 lb

## 2015-09-17 DIAGNOSIS — S060X9A Concussion with loss of consciousness of unspecified duration, initial encounter: Secondary | ICD-10-CM

## 2015-09-17 DIAGNOSIS — S060X9D Concussion with loss of consciousness of unspecified duration, subsequent encounter: Secondary | ICD-10-CM

## 2015-09-17 NOTE — Progress Notes (Signed)
   Subjective:    Patient ID: Kenneth Snyder, male    DOB: [birth year], 60 y.o.   MRN: [id number] By signing my name below, I, Zola Button, attest that this documentation has been prepared under the direction and in the presence of Robyn Haber, MD.  Electronically Signed: Zola Button, Medical Scribe. 09/17/2015. 9:57 AM.  HPI HPI Comments: Kenneth Snyder is a 60 y.o. male who presents to the [facility] for a follow-up for a worker's comp injury. Patient sustained a head injury/concussion on 1/25. See previous visits. Patient has been having headaches occasionally. He also reports having numbness in his fingers, which is improving. Patient has improved overall and intends to return to work tomorrow.  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  Note by me on 2/9: Kenneth Snyder is a 60 y.o. male who presents to [facility] complaining of a follow up on a concussion that occurred 2 week ago.  Pt was seen here at the office on 02/01 by Dr. Linna Darner and was diagnosed with concussion.  Pt reports that as he was carrying a pack of fertilizer he hit his head on a rail. He states that he lost consciousness for about 30 seconds, this incident was not witnesses however. He notes that head pain experienced was on the opposite side of the side of injury. Pt indicates that his headache have resolved, and his ability to concentrate has increased, however he indicates that when he reads using his kindle, he is able to concentrated for only about 15 minutes, but after that his eyes start to water and he starts experiencing severe headaches. Pt still have symptoms of light sensitivity, and difficulty with holding his balance.  He denies arms weakness.   Review of Systems  Neurological: Positive for numbness and headaches.       Objective:   Physical Exam CONSTITUTIONAL: Well developed/well nourished HEAD: Normocephalic/atraumatic EYES: EOM/PERRL ENMT:  Mucous membranes moist. No abnormality. NECK: supple no meningeal signs SPINE: entire spine nontender CV: S1/S2 noted, no murmurs/rubs/gallops noted LUNGS: Lungs are clear to auscultation bilaterally, no apparent distress ABDOMEN: soft, nontender, no rebound or guarding GU: no cva tenderness NEURO: Pt is awake/alert, moves all extremitiesx4. Cranial nerves intact. Good reflexes. Symmetric strength. EXTREMITIES: pulses normal, full ROM SKIN: warm, color normal PSYCH: no abnormalities of mood noted  neuro exam: Cranial nerves III through XII intact, neck supple without tenderness , reflexes normal, motor exam normal        Assessment & Plan:   This chart was scribed in my presence and reviewed by me personally.    ICD-9-CM ICD-10-CM   1. Concussion with loss of consciousness, unspecified duration, initial encounter 850.5 [id number]      Signed, Robyn Haber, MD

## 2015-09-17 NOTE — Patient Instructions (Signed)
He may return to work now. There is no sign of residual injury.

## 2015-10-30 ENCOUNTER — Telehealth: Payer: Self-pay

## 2015-10-30 NOTE — Telephone Encounter (Signed)
Still no FMLA forms in my box.  I'll do this tomorrow if they are ready

## 2015-10-30 NOTE — Telephone Encounter (Signed)
Patient needs FMLA forms completed by Dr. Carlean Jews I have filled out what I could from the [place] notes about his concussion and highlighted what needs to be completed, I will place in your box on 10/30/15. If you could complete and return to the FMLA/Disability box at the [employer] checkout desk within 5-7 business days. Thank you!

## 2015-11-01 NOTE — Telephone Encounter (Signed)
Completed paperwork scanned and put in pick up draw for patient to get on 11/01/15

## 2016-08-04 HISTORY — PX: DG KNEE RIGHT COMPLETE (ARMC HX): HXRAD1559

## 2017-03-26 IMAGING — CT CT HEAD W/O CM
2 series · 17 of 30 positions shown, 20 images · non-contrast
Comparison: None.

CLINICAL DATA: Recent blunt trauma to the head with apparent loss
of consciousness

EXAM:
CT HEAD WITHOUT CONTRAST
TECHNIQUE: Contiguous axial images were obtained from the base of the skull
through the vertex without intravenous contrast.

[Series 2: head w/o · axial · non-contrast · 0.47mm/px · z∈[-83,+47]mm · 9 of 34 slices shown, 12 images]
[im 4/34  brain]
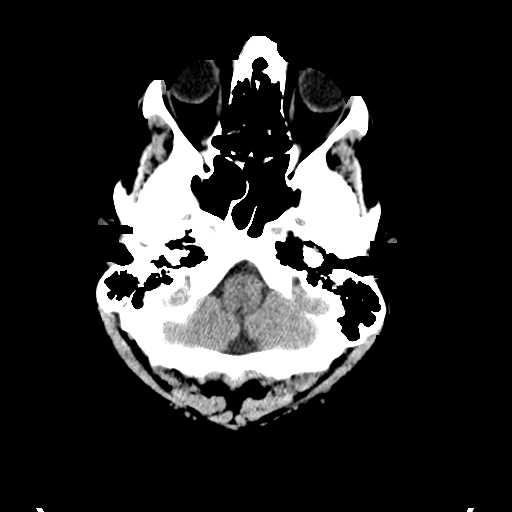
[im 4/34  bone]
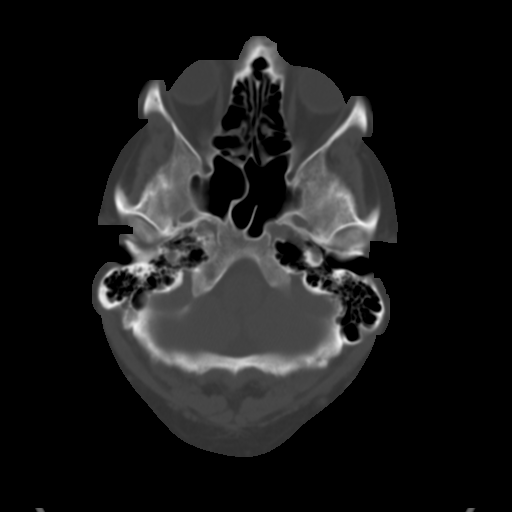
[im 7/34  brain]
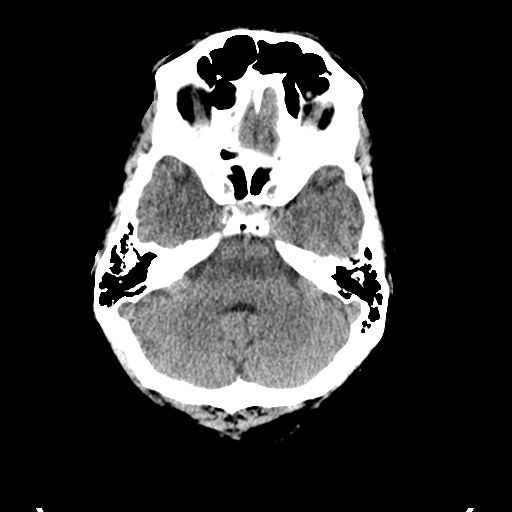
[im 10/34  brain]
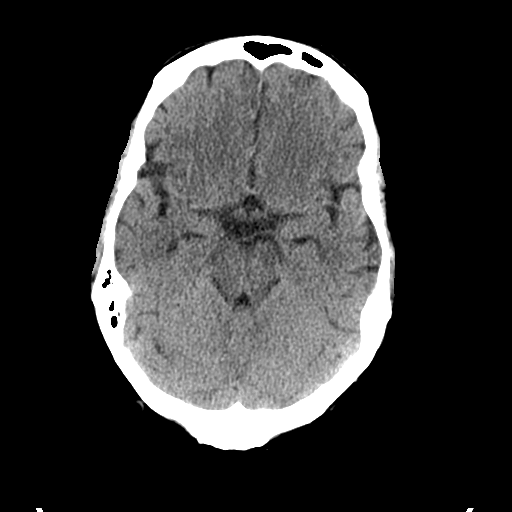
[im 14/34  brain]
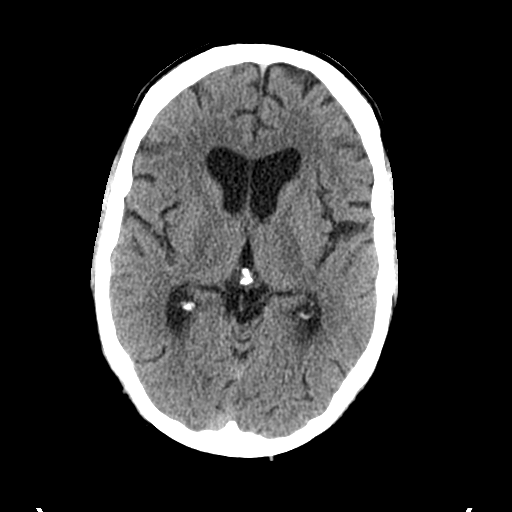
[im 17/34  brain]
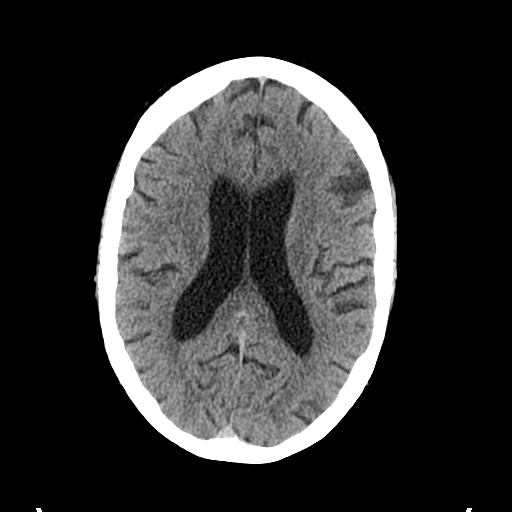
[im 17/34  bone]
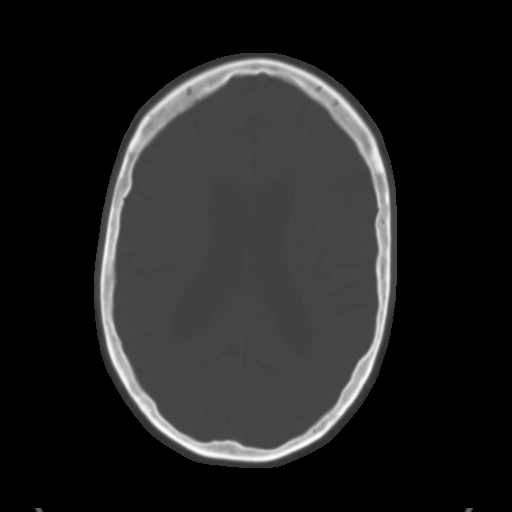
[im 20/34  brain]
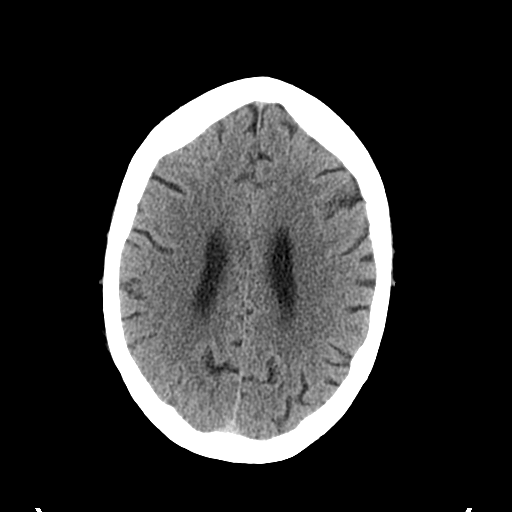
[im 24/34  brain]
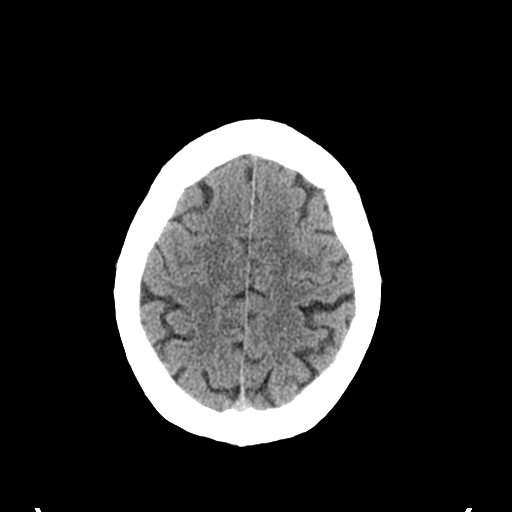
[im 27/34  brain]
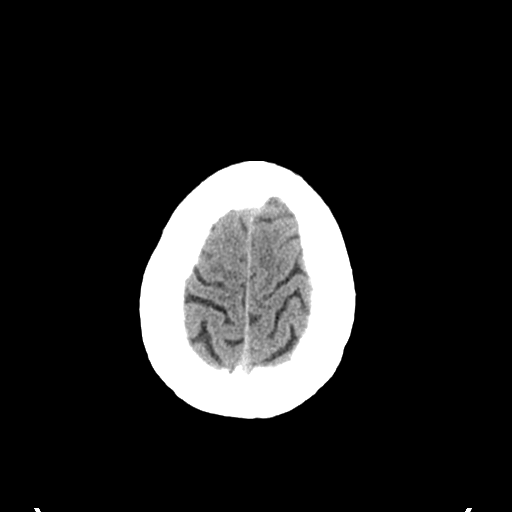
[im 30/34  brain]
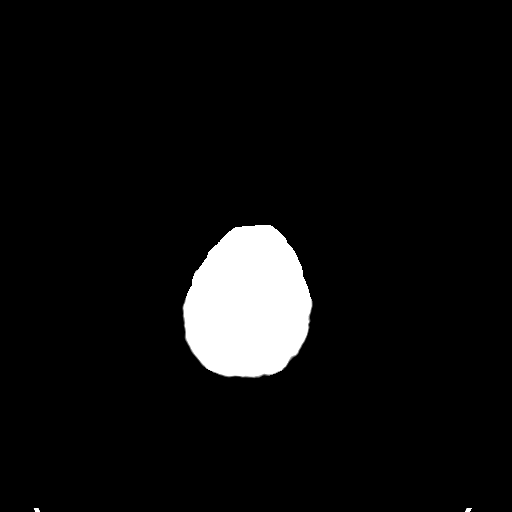
[im 30/34  bone]
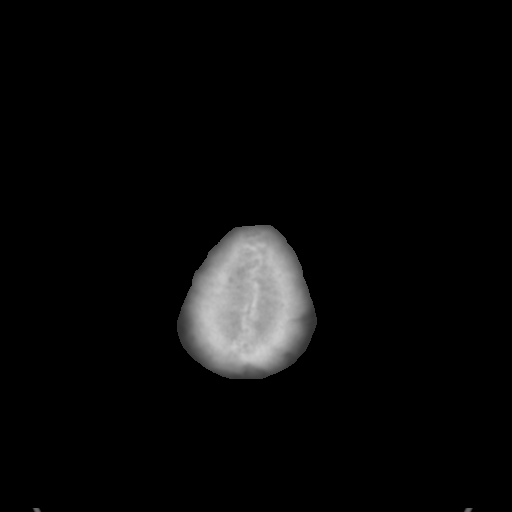

[Series 3: bone windows · axial · 0.47mm/px · z∈[-80,+49]mm · 8 of 57 slices shown]
[im 7/57  bone]
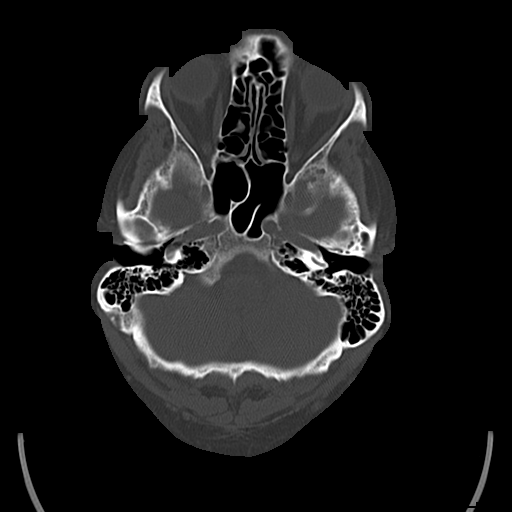
[im 13/57  bone]
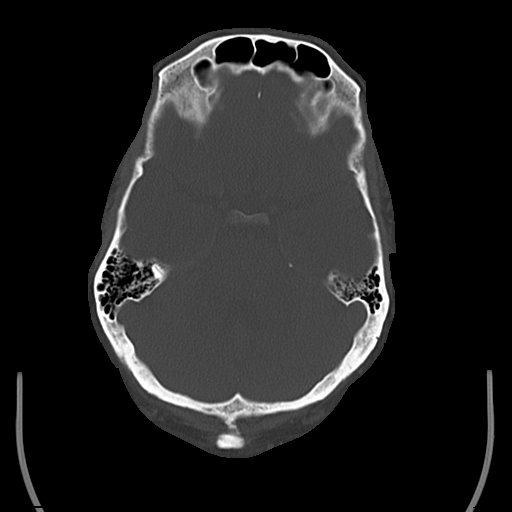
[im 19/57  bone]
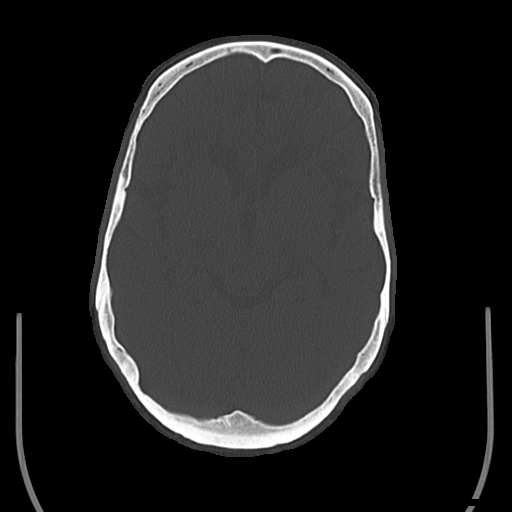
[im 25/57  bone]
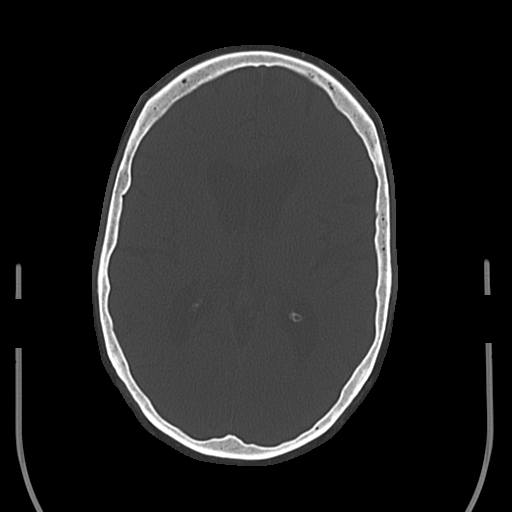
[im 32/57  bone]
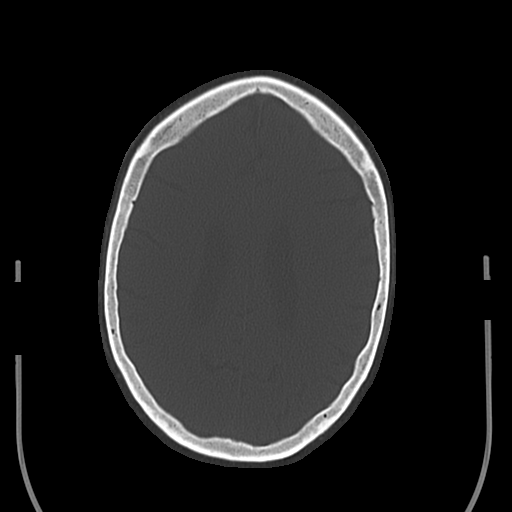
[im 38/57  bone]
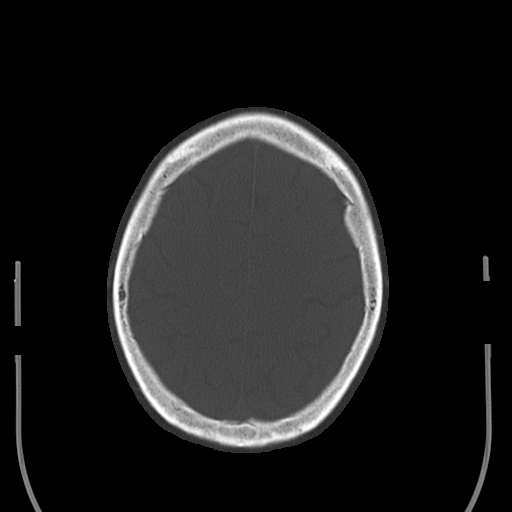
[im 44/57  bone]
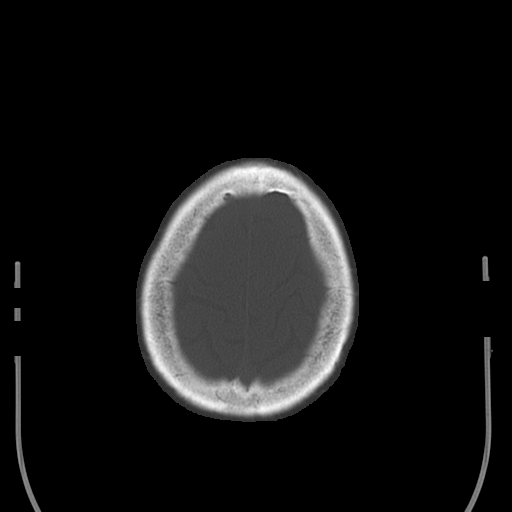
[im 50/57  bone]
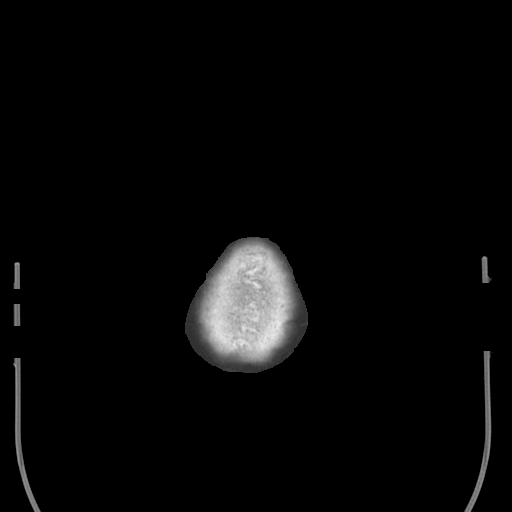

[17 of 30 positions shown; findings below may reference images not displayed]

FINDINGS: The bony calvarium is intact. No findings to suggest acute
hemorrhage, acute infarction or space-occupying mass lesion are
noted.
IMPRESSION: No acute abnormality noted.

## 2018-12-16 DIAGNOSIS — M25561 Pain in right knee: Secondary | ICD-10-CM | POA: Diagnosis not present

## 2019-01-13 DIAGNOSIS — Z23 Encounter for immunization: Secondary | ICD-10-CM | POA: Diagnosis not present

## 2019-02-23 DIAGNOSIS — H9201 Otalgia, right ear: Secondary | ICD-10-CM | POA: Diagnosis not present

## 2019-02-23 DIAGNOSIS — T161XXA Foreign body in right ear, initial encounter: Secondary | ICD-10-CM | POA: Diagnosis not present

## 2019-03-24 DIAGNOSIS — Z131 Encounter for screening for diabetes mellitus: Secondary | ICD-10-CM | POA: Diagnosis not present

## 2019-03-24 DIAGNOSIS — Z1159 Encounter for screening for other viral diseases: Secondary | ICD-10-CM | POA: Diagnosis not present

## 2019-03-24 DIAGNOSIS — Z114 Encounter for screening for human immunodeficiency virus [HIV]: Secondary | ICD-10-CM | POA: Diagnosis not present

## 2019-03-24 DIAGNOSIS — Z0000 Encounter for general adult medical examination without abnormal findings: Secondary | ICD-10-CM | POA: Diagnosis not present

## 2019-03-24 DIAGNOSIS — Z125 Encounter for screening for malignant neoplasm of prostate: Secondary | ICD-10-CM | POA: Diagnosis not present

## 2019-03-24 DIAGNOSIS — E559 Vitamin D deficiency, unspecified: Secondary | ICD-10-CM | POA: Diagnosis not present

## 2019-03-24 DIAGNOSIS — Z1389 Encounter for screening for other disorder: Secondary | ICD-10-CM | POA: Diagnosis not present

## 2020-03-15 DIAGNOSIS — H35363 Drusen (degenerative) of macula, bilateral: Secondary | ICD-10-CM | POA: Diagnosis not present

## 2020-05-12 DIAGNOSIS — Z20822 Contact with and (suspected) exposure to covid-19: Secondary | ICD-10-CM | POA: Diagnosis not present

## 2020-05-18 DIAGNOSIS — Z20822 Contact with and (suspected) exposure to covid-19: Secondary | ICD-10-CM | POA: Diagnosis not present

## 2020-07-11 DIAGNOSIS — R059 Cough, unspecified: Secondary | ICD-10-CM | POA: Diagnosis not present

## 2020-07-11 DIAGNOSIS — U071 COVID-19: Secondary | ICD-10-CM | POA: Diagnosis not present

## 2020-09-18 ENCOUNTER — Other Ambulatory Visit: Payer: Self-pay

## 2020-09-18 ENCOUNTER — Encounter: Payer: Self-pay | Admitting: Physician Assistant

## 2020-09-18 ENCOUNTER — Ambulatory Visit (INDEPENDENT_AMBULATORY_CARE_PROVIDER_SITE_OTHER): Payer: BC Managed Care – PPO | Admitting: Physician Assistant

## 2020-09-18 VITALS — BP 150/90 | HR 85 | Temp 97.4°F | Resp 16 | Ht 70.0 in | Wt 250.0 lb

## 2020-09-18 DIAGNOSIS — H918X3 Other specified hearing loss, bilateral: Secondary | ICD-10-CM

## 2020-09-18 DIAGNOSIS — M1711 Unilateral primary osteoarthritis, right knee: Secondary | ICD-10-CM

## 2020-09-18 DIAGNOSIS — R4189 Other symptoms and signs involving cognitive functions and awareness: Secondary | ICD-10-CM | POA: Diagnosis not present

## 2020-09-18 DIAGNOSIS — I10 Essential (primary) hypertension: Secondary | ICD-10-CM | POA: Diagnosis not present

## 2020-09-18 DIAGNOSIS — Z87820 Personal history of traumatic brain injury: Secondary | ICD-10-CM

## 2020-09-18 DIAGNOSIS — Z820 Family history of epilepsy and other diseases of the nervous system: Secondary | ICD-10-CM

## 2020-09-18 NOTE — Progress Notes (Addendum)
New Patient Office Visit  Subjective:  Patient ID: Kenneth Snyder, male    DOB: 06-02-1956  Age: 65 y.o. MRN: [id number]  CC:  Chief Complaint  Patient presents with  . Establish Care    Previous PCP was Tammy Speace  . Memory Loss    Since his concussion in 2017, having increased memory loss.  Marland Kitchen Hearing Problem    Washed his hearing aids with clothes, difficult with hearing. Unable to carry on a conversation due to lack of hearing. Not sure if the memory loss goes along with hearing difficulties.    HPI Kenneth Snyder presents for new patient establishment. Here with his wife. They have two daughters and two granddaughters.  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 Several R knee surgeries in his life, now "lives in a brace." Says that he has had a few ortho consultations who have said there is nothing else they can do. Currently waiting on disability hearing for this. Smokes marijuana intermittently to help with his pain.   Prior detached tendon in left arm, repaired and doing well.  Concussion after hitting head on steel beam in Summer 2017, out of work about 4-6 weeks at that time. He went to ER after the LOC and had head scan. Never saw neurology. He is retired now.  Comprehension issues - is this from loss of hearing or lingering post-concussion they are wondering? No memory issues in his family. Has not had his hearing aids "in awhile." Seems to take awhile to respond to questions and patient thinks it may be more than just hearing deficit.   Strong family history of ALS - Mom, sister, and brother all deceased.   Past Medical History:  Diagnosis Date  . Allergy     Past Surgical History:  Procedure Laterality Date  . DG KNEE RIGHT COMPLETE ([place] [facility]X) Right 2018  . TENDON REPAIR Left arm    Family History  Problem Relation Age of Onset  . Cancer Mother   . ALS Mother   . Cancer Father   . ALS Sister   . ALS Brother     Social History   Socioeconomic History  .  Marital status: Married    Spouse name: Not on file  . Number of children: Not on file  . Years of education: Not on file  . Highest education level: Not on file  Occupational History  . Not on file  Tobacco Use  . Smoking status: Never Smoker  . Smokeless tobacco: Never Used  Vaping Use  . Vaping Use: Never used  Substance and Sexual Activity  . Alcohol use: Yes    Alcohol/week: 8.0 standard drinks    Types: 8 Cans of beer per week  . Drug use: Yes    Types: Marijuana  . Sexual activity: Yes    Birth control/protection: None  Other Topics Concern  . Not on file  Social History Narrative  . Not on file   Social Determinants of Health   Financial Resource Strain: Not on file  Food Insecurity: Not on file  Transportation Needs: Not on file  Physical Activity: Not on file  Stress: Not on file  Social Connections: Not on file  Intimate Partner Violence: Not on file    ROS Review of Systems  Constitutional: Negative for activity change, appetite change and unexpected weight change.  HENT: Positive for hearing loss. Negative for congestion.   Eyes: Negative for visual disturbance.  Respiratory: Negative for apnea and  shortness of breath.   Cardiovascular: Negative for chest pain.  Gastrointestinal: Negative for abdominal pain and blood in stool.  Endocrine: Negative for polydipsia, polyphagia and polyuria.  Genitourinary: Negative for difficulty urinating.  Musculoskeletal: Positive for arthralgias (chronic right knee pain).  Skin: Negative for rash.  Neurological: Negative for dizziness, seizures, weakness and headaches.  Psychiatric/Behavioral: Negative for sleep disturbance and suicidal ideas.    Objective:   Today's Vitals: BP (!) 150/90   Pulse 85   Temp (!) 97.4 F (36.3 C) (Temporal)   Resp 16   Ht 5\' 10"  (1.778 m)   Wt 250 lb (113.4 kg)   SpO2 98%   BMI 35.87 kg/m   Physical Exam Vitals and nursing note reviewed.  Constitutional:      General: He  is not in acute distress.    Appearance: Normal appearance. He is not toxic-appearing.  HENT:     Head: Normocephalic and atraumatic.     Right Ear: Tympanic membrane, ear canal and external ear normal. Decreased hearing noted.     Left Ear: Tympanic membrane, ear canal and external ear normal. Decreased hearing noted.     Ears:     Comments: FAILED OAE HEARING SCREENING    Nose: Nose normal.     Mouth/Throat:     Mouth: Mucous membranes are moist.     Pharynx: Oropharynx is clear.  Eyes:     Extraocular Movements: Extraocular movements intact.     Conjunctiva/sclera: Conjunctivae normal.     Pupils: Pupils are equal, round, and reactive to light.  Cardiovascular:     Rate and Rhythm: Normal rate and regular rhythm.     Pulses: Normal pulses.     Heart sounds: Normal heart sounds.  Pulmonary:     Effort: Pulmonary effort is normal.     Breath sounds: Normal breath sounds.  Abdominal:     General: Abdomen is flat. Bowel sounds are normal.     Palpations: Abdomen is soft.     Tenderness: There is no abdominal tenderness.  Musculoskeletal:        General: Normal range of motion.     Cervical back: Normal range of motion and neck supple.     Comments: R KNEE IN BRACE  Skin:    General: Skin is warm and dry.  Neurological:     General: No focal deficit present.     Mental Status: He is alert and oriented to person, place, and time.  Psychiatric:        Mood and Affect: Mood normal.        Behavior: Behavior normal.     Assessment & Plan:   Problem List Items Addressed This Visit   None   Visit Diagnoses    Essential hypertension    -  Primary   Other specified hearing loss of both ears       History of brain concussion       Impairment of comprehension       Primary osteoarthritis of right knee       Family hx of ALS (amyotrophic lateral sclerosis)          Outpatient Encounter Medications as of 09/18/2020  Medication Sig  . [DISCONTINUED] budesonide (RHINOCORT  ALLERGY) 32 MCG/ACT nasal spray Place 2 sprays into both nostrils daily as needed for rhinitis or allergies.  . [DISCONTINUED] fexofenadine (ALLEGRA) 180 MG tablet Take 180 mg by mouth daily as needed for allergies.   . [DISCONTINUED] oxyCODONE-acetaminophen (PERCOCET/ROXICET) 5-325  MG per tablet Take 1 tablet by mouth every 4 (four) hours as needed for severe pain. (Patient not taking: Reported on 08/29/2015)  . [DISCONTINUED] ranitidine (ZANTAC) 150 MG tablet Take 150 mg by mouth 2 (two) times daily as needed for heartburn.   No facility-administered encounter medications on file as of 09/18/2020.    Follow-up: Return in about 4 weeks (around 10/16/2020) for CPE with labs same day.    1. Essential hypertension His blood pressure is elevated in office today.  Rechecked again at the end of appointment and it was still the same.  I am going to have him monitor at home and work on lowering salt in diet and drinking more water.  If still elevated at next appointment, we will discuss medication.  2. Other specified hearing loss of both ears Confirmed with hearing machine today, failed on both sides.  He is going to look into [employer] or [employer] for new hearing aids.  3. History of brain concussion See HPI for discussion on this.  CT head without contrast on 08/29/2015 was negative for any abnormalities.  4. Impairment of comprehension Given his history and apparent worsening symptoms as discussed by patient and wife, I am going to refer him to neurology for consultation.  5. Primary osteoarthritis of right knee He does not want any further consultations on this right now.  He is managing at home with his daily brace.  6. Family hx of ALS (amyotrophic lateral sclerosis) Again with significant family history and new possible cognition impairment, important to have neurology consult.  Patient and his wife are agreeable today.   This visit occurred during the SARS-CoV-2 public health emergency.   Safety protocols were in place, including screening questions prior to the visit, additional usage of staff PPE, and extensive cleaning of exam room while observing appropriate contact time as indicated for disinfecting solutions.   Total time spent with patient and wife obtaining history and discussing plan listed above: 45 minutes.  Juwon Scripter M Jesicca Dipierro, PA-C

## 2020-09-18 NOTE — Patient Instructions (Signed)
Great to meet you today! See you back for a complete physical exam. Someone will call about neurology referral in the next 7-10 days. Please try to shop around for new hearing aids. Monitor BP at home if possible - limit salt in diet and drink more water. Bring log to next appointment.     Hypertension, Adult High blood pressure (hypertension) is when the force of blood pumping through the arteries is too strong. The arteries are the blood vessels that carry blood from the heart throughout the body. Hypertension forces the heart to work harder to pump blood and may cause arteries to become narrow or stiff. Untreated or uncontrolled hypertension can cause a heart attack, heart failure, a stroke, kidney disease, and other problems. A blood pressure reading consists of a higher number over a lower number. Ideally, your blood pressure should be below 120/80. The first ("top") number is called the systolic pressure. It is a measure of the pressure in your arteries as your heart beats. The second ("bottom") number is called the diastolic pressure. It is a measure of the pressure in your arteries as the heart relaxes. What are the causes? The exact cause of this condition is not known. There are some conditions that result in or are related to high blood pressure. What increases the risk? Some risk factors for high blood pressure are under your control. The following factors may make you more likely to develop this condition:  Smoking.  Having type 2 diabetes mellitus, high cholesterol, or both.  Not getting enough exercise or physical activity.  Being overweight.  Having too much fat, sugar, calories, or salt (sodium) in your diet.  Drinking too much alcohol. Some risk factors for high blood pressure may be difficult or impossible to change. Some of these factors include:  Having chronic kidney disease.  Having a family history of high blood pressure.  Age. Risk increases with  age.  Race. You may be at higher risk if you are African American.  Gender. Men are at higher risk than women before age 65. After age 1, women are at higher risk than men.  Having obstructive sleep apnea.  Stress. What are the signs or symptoms? High blood pressure may not cause symptoms. Very high blood pressure (hypertensive crisis) may cause:  Headache.  Anxiety.  Shortness of breath.  Nosebleed.  Nausea and vomiting.  Vision changes.  Severe chest pain.  Seizures. How is this diagnosed? This condition is diagnosed by measuring your blood pressure while you are seated, with your arm resting on a flat surface, your legs uncrossed, and your feet flat on the floor. The cuff of the blood pressure monitor will be placed directly against the skin of your upper arm at the level of your heart. It should be measured at least twice using the same arm. Certain conditions can cause a difference in blood pressure between your right and left arms. Certain factors can cause blood pressure readings to be lower or higher than normal for a short period of time:  When your blood pressure is higher when you are in a health care provider's office than when you are at home, this is called white coat hypertension. Most people with this condition do not need medicines.  When your blood pressure is higher at home than when you are in a health care provider's office, this is called masked hypertension. Most people with this condition may need medicines to control blood pressure. If you have a high blood  pressure reading during one visit or you have normal blood pressure with other risk factors, you may be asked to:  Return on a different day to have your blood pressure checked again.  Monitor your blood pressure at home for 1 week or longer. If you are diagnosed with hypertension, you may have other blood or imaging tests to help your health care provider understand your overall risk for other  conditions. How is this treated? This condition is treated by making healthy lifestyle changes, such as eating healthy foods, exercising more, and reducing your alcohol intake. Your health care provider may prescribe medicine if lifestyle changes are not enough to get your blood pressure under control, and if:  Your systolic blood pressure is above 130.  Your diastolic blood pressure is above 80. Your personal target blood pressure may vary depending on your medical conditions, your age, and other factors. Follow these instructions at home: Eating and drinking  Eat a diet that is high in fiber and potassium, and low in sodium, added sugar, and fat. An example eating plan is called the DASH (Dietary Approaches to Stop Hypertension) diet. To eat this way: ? Eat plenty of fresh fruits and vegetables. Try to fill one half of your plate at each meal with fruits and vegetables. ? Eat whole grains, such as whole-wheat pasta, brown rice, or whole-grain bread. Fill about one fourth of your plate with whole grains. ? Eat or drink low-fat dairy products, such as skim milk or low-fat yogurt. ? Avoid fatty cuts of meat, processed or cured meats, and poultry with skin. Fill about one fourth of your plate with lean proteins, such as fish, chicken without skin, beans, eggs, or tofu. ? Avoid pre-made and processed foods. These tend to be higher in sodium, added sugar, and fat.  Reduce your daily sodium intake. Most people with hypertension should eat less than 1,500 mg of sodium a day.  Do not drink alcohol if: ? Your health care provider tells you not to drink. ? You are pregnant, may be pregnant, or are planning to become pregnant.  If you drink alcohol: ? Limit how much you use to:  0-1 drink a day for women.  0-2 drinks a day for men. ? Be aware of how much alcohol is in your drink. In the [place] one drink equals one 12 oz bottle of beer (355 mL), one 5 oz glass of wine (148 mL), or one 1 oz glass  of hard liquor (44 mL).   Lifestyle  Work with your health care provider to maintain a healthy body weight or to lose weight. Ask what an ideal weight is for you.  Get at least 30 minutes of exercise most days of the week. Activities may include walking, swimming, or biking.  Include exercise to strengthen your muscles (resistance exercise), such as Pilates or lifting weights, as part of your weekly exercise routine. Try to do these types of exercises for 30 minutes at least 3 days a week.  Do not use any products that contain nicotine or tobacco, such as cigarettes, e-cigarettes, and chewing tobacco. If you need help quitting, ask your health care provider.  Monitor your blood pressure at home as told by your health care provider.  Keep all follow-up visits as told by your health care provider. This is important.   Medicines  Take over-the-counter and prescription medicines only as told by your health care provider. Follow directions carefully. Blood pressure medicines must be taken as prescribed.  Do not skip doses of blood pressure medicine. Doing this puts you at risk for problems and can make the medicine less effective.  Ask your health care provider about side effects or reactions to medicines that you should watch for. Contact a health care provider if you:  Think you are having a reaction to a medicine you are taking.  Have headaches that keep coming back (recurring).  Feel dizzy.  Have swelling in your ankles.  Have trouble with your vision. Get help right away if you:  Develop a severe headache or confusion.  Have unusual weakness or numbness.  Feel faint.  Have severe pain in your chest or abdomen.  Vomit repeatedly.  Have trouble breathing. Summary  Hypertension is when the force of blood pumping through your arteries is too strong. If this condition is not controlled, it may put you at risk for serious complications.  Your personal target blood pressure  may vary depending on your medical conditions, your age, and other factors. For most people, a normal blood pressure is less than 120/80.  Hypertension is treated with lifestyle changes, medicines, or a combination of both. Lifestyle changes include losing weight, eating a healthy, low-sodium diet, exercising more, and limiting alcohol. This information is not intended to replace advice given to you by your health care provider. Make sure you discuss any questions you have with your health care provider. Document Revised: 03/31/2018 Document Reviewed: 03/31/2018 Elsevier Patient Education  2021 [employer].

## 2020-10-16 ENCOUNTER — Other Ambulatory Visit: Payer: Self-pay

## 2020-10-16 ENCOUNTER — Ambulatory Visit (INDEPENDENT_AMBULATORY_CARE_PROVIDER_SITE_OTHER): Payer: BC Managed Care – PPO | Admitting: Physician Assistant

## 2020-10-16 ENCOUNTER — Encounter: Payer: Self-pay | Admitting: Physician Assistant

## 2020-10-16 VITALS — BP 157/85 | HR 73 | Temp 97.6°F | Ht 70.0 in | Wt 248.0 lb

## 2020-10-16 DIAGNOSIS — I10 Essential (primary) hypertension: Secondary | ICD-10-CM

## 2020-10-16 DIAGNOSIS — Z114 Encounter for screening for human immunodeficiency virus [HIV]: Secondary | ICD-10-CM | POA: Diagnosis not present

## 2020-10-16 DIAGNOSIS — Z0000 Encounter for general adult medical examination without abnormal findings: Secondary | ICD-10-CM

## 2020-10-16 DIAGNOSIS — Z1159 Encounter for screening for other viral diseases: Secondary | ICD-10-CM

## 2020-10-16 DIAGNOSIS — Z131 Encounter for screening for diabetes mellitus: Secondary | ICD-10-CM

## 2020-10-16 DIAGNOSIS — Z13220 Encounter for screening for lipoid disorders: Secondary | ICD-10-CM

## 2020-10-16 DIAGNOSIS — Z1211 Encounter for screening for malignant neoplasm of colon: Secondary | ICD-10-CM

## 2020-10-16 DIAGNOSIS — M1711 Unilateral primary osteoarthritis, right knee: Secondary | ICD-10-CM

## 2020-10-16 LAB — CBC WITH DIFFERENTIAL/PLATELET
Basophils Absolute: 0 10*3/uL (ref 0.0–0.1)
Basophils Relative: 0.5 % (ref 0.0–3.0)
Eosinophils Absolute: 0.3 10*3/uL (ref 0.0–0.7)
Eosinophils Relative: 4 % (ref 0.0–5.0)
HCT: 46.6 % (ref 39.0–52.0)
Hemoglobin: 16.1 g/dL (ref 13.0–17.0)
Lymphocytes Relative: 29.5 % (ref 12.0–46.0)
Lymphs Abs: 2.6 10*3/uL (ref 0.7–4.0)
MCHC: 34.5 g/dL (ref 30.0–36.0)
MCV: 90.1 fl (ref 78.0–100.0)
Monocytes Absolute: 0.7 10*3/uL (ref 0.1–1.0)
Monocytes Relative: 7.9 % (ref 3.0–12.0)
Neutro Abs: 5.1 10*3/uL (ref 1.4–7.7)
Neutrophils Relative %: 58.1 % (ref 43.0–77.0)
Platelets: 233 10*3/uL (ref 150.0–400.0)
RBC: 5.17 Mil/uL (ref 4.22–5.81)
RDW: 14.5 % (ref 11.5–15.5)
WBC: 8.8 10*3/uL (ref 4.0–10.5)

## 2020-10-16 LAB — COMPREHENSIVE METABOLIC PANEL
ALT: 33 U/L (ref 0–53)
AST: 24 U/L (ref 0–37)
Albumin: 4.2 g/dL (ref 3.5–5.2)
Alkaline Phosphatase: 83 U/L (ref 39–117)
BUN: 11 mg/dL (ref 6–23)
CO2: 25 mEq/L (ref 19–32)
Calcium: 9.4 mg/dL (ref 8.4–10.5)
Chloride: 105 mEq/L (ref 96–112)
Creatinine, Ser: 0.92 mg/dL (ref 0.40–1.50)
GFR: 88.31 mL/min (ref >60.00)
Glucose, Bld: 103 mg/dL — ABNORMAL HIGH (ref 70–99)
Potassium: 4.2 mEq/L (ref 3.5–5.1)
Sodium: 139 mEq/L (ref 135–145)
Total Bilirubin: 0.7 mg/dL (ref 0.2–1.2)
Total Protein: 7.3 g/dL (ref 6.0–8.3)

## 2020-10-16 LAB — LIPID PANEL
Cholesterol: 192 mg/dL (ref 0–200)
HDL: 43.1 mg/dL (ref >39.00)
LDL Cholesterol: 125 mg/dL — ABNORMAL HIGH (ref 0–99)
NonHDL: 148.99
Total CHOL/HDL Ratio: 4
Triglycerides: 121 mg/dL (ref 0.0–149.0)
VLDL: 24.2 mg/dL (ref 0.0–40.0)

## 2020-10-16 MED ORDER — LOSARTAN POTASSIUM 25 MG PO TABS: 25.0000 mg | ORAL_TABLET | Freq: Every day | ORAL | 0 refills | Status: DC

## 2020-10-16 NOTE — Patient Instructions (Addendum)
Please ask your pharmacy about the Shingles Vaccine. Labs will be drawn today and I will call with results. I will see you back in 3-4 weeks for a blood pressure recheck. (Take the Losartan daily!) Someone will call to schedule the colonoscopy.     Preventive Care 62-63 Years Old, Male Preventive care refers to lifestyle choices and visits with your health care provider that can promote health and wellness. This includes:  A yearly physical exam. This is also called an annual wellness visit.  Regular dental and eye exams.  Immunizations.  Screening for certain conditions.  Healthy lifestyle choices, such as: ? Eating a healthy diet. ? Getting regular exercise. ? Not using drugs or products that contain nicotine and tobacco. ? Limiting alcohol use. What can I expect for my preventive care visit? Physical exam Your health care provider will check your:  Height and weight. These may be used to calculate your BMI (body mass index). BMI is a measurement that tells if you are at a healthy weight.  Heart rate and blood pressure.  Body temperature.  Skin for abnormal spots. Counseling Your health care provider may ask you questions about your:  Past medical problems.  Family's medical history.  Alcohol, tobacco, and drug use.  Emotional well-being.  Home life and relationship well-being.  Sexual activity.  Diet, exercise, and sleep habits.  Work and work Statistician.  Access to firearms. What immunizations do I need? Vaccines are usually given at various ages, according to a schedule. Your health care provider will recommend vaccines for you based on your age, medical history, and lifestyle or other factors, such as travel or where you work.   What tests do I need? Blood tests  Lipid and cholesterol levels. These may be checked every 5 years, or more often if you are over 55 years old.  Hepatitis C test.  Hepatitis B test. Screening  Lung cancer screening.  You may have this screening every year starting at age 10 if you have a 30-pack-year history of smoking and currently smoke or have quit within the past 15 years.  Prostate cancer screening. Recommendations will vary depending on your family history and other risks.  Genital exam to check for testicular cancer or hernias.  Colorectal cancer screening. ? All adults should have this screening starting at age 37 and continuing until age 60. ? Your health care provider may recommend screening at age 13 if you are at increased risk. ? You will have tests every 1-10 years, depending on your results and the type of screening test.  Diabetes screening. ? This is done by checking your blood sugar (glucose) after you have not eaten for a while (fasting). ? You may have this done every 1-3 years.  STD (sexually transmitted disease) testing, if you are at risk. Follow these instructions at home: Eating and drinking  Eat a diet that includes fresh fruits and vegetables, whole grains, lean protein, and low-fat dairy products.  Take vitamin and mineral supplements as recommended by your health care provider.  Do not drink alcohol if your health care provider tells you not to drink.  If you drink alcohol: ? Limit how much you have to 0-2 drinks a day. ? Be aware of how much alcohol is in your drink. In the [place] one drink equals one 12 oz bottle of beer (355 mL), one 5 oz glass of wine (148 mL), or one 1 oz glass of hard liquor (44 mL).   Lifestyle  Take  daily care of your teeth and gums. Brush your teeth every morning and night with fluoride toothpaste. Floss one time each day.  Stay active. Exercise for at least 30 minutes 5 or more days each week.  Do not use any products that contain nicotine or tobacco, such as cigarettes, e-cigarettes, and chewing tobacco. If you need help quitting, ask your health care provider.  Do not use drugs.  If you are sexually active, practice safe sex. Use a  condom or other form of protection to prevent STIs (sexually transmitted infections).  If told by your health care provider, take low-dose aspirin daily starting at age 68.  Find healthy ways to cope with stress, such as: ? Meditation, yoga, or listening to music. ? Journaling. ? Talking to a trusted person. ? Spending time with friends and family. Safety  Always wear your seat belt while driving or riding in a vehicle.  Do not drive: ? If you have been drinking alcohol. Do not ride with someone who has been drinking. ? When you are tired or distracted. ? While texting.  Wear a helmet and other protective equipment during sports activities.  If you have firearms in your house, make sure you follow all gun safety procedures. What's next?  Go to your health care provider once a year for an annual wellness visit.  Ask your health care provider how often you should have your eyes and teeth checked.  Stay up to date on all vaccines. This information is not intended to replace advice given to you by your health care provider. Make sure you discuss any questions you have with your health care provider. Document Revised: 04/19/2019 Document Reviewed: 07/15/2018 Elsevier Patient Education  2021 [employer].

## 2020-10-16 NOTE — Progress Notes (Signed)
Established Patient Office Visit  Subjective:  Patient ID: Kenneth Snyder, male    DOB: 08-15-1955  Age: 65 y.o. MRN: [id number]  CC:  Chief Complaint  Patient presents with  . Annual Exam    HPI [name] presents for annual physical exam.  Acute concerns: None  Health maintenance: Lifestyle/ exercise: Due to right knee issues, he has trouble with any exercise Nutrition: Wife cooks, he feels like he doesn't eat as much as he used to in younger years Mental health: Denies any issues with depression or anxiety  Caffeine: None Sleep: 7-8 hours / night Substance use: Occasional marijuana for R knee pain; rarely beers Sexual activity: Intimate with wife, some issues with ED, doesn't want any meds though Immunizations: Flu every year, COVID x 3, Tdap 01/13/19 Colonoscopy: He says he had one at 21 (?) only one he had, but says no issues found.    Past Medical History:  Diagnosis Date  . Allergy     Past Surgical History:  Procedure Laterality Date  . DG KNEE RIGHT COMPLETE ([place] HX) Right 2018  . TENDON REPAIR Left arm    Family History  Problem Relation Age of Onset  . Cancer Mother   . ALS Mother   . Cancer Father   . ALS Sister   . ALS Brother     Social History   Socioeconomic History  . Marital status: Married    Spouse name: Not on file  . Number of children: Not on file  . Years of education: Not on file  . Highest education level: Not on file  Occupational History  . Not on file  Tobacco Use  . Smoking status: Never Smoker  . Smokeless tobacco: Never Used  Vaping Use  . Vaping Use: Never used  Substance and Sexual Activity  . Alcohol use: Yes    Alcohol/week: 8.0 standard drinks    Types: 8 Cans of beer per week  . Drug use: Yes    Types: Marijuana  . Sexual activity: Yes    Birth control/protection: None  Other Topics Concern  . Not on file  Social History Narrative  . Not on file   Social Determinants of Health   Financial  Resource Strain: Not on file  Food Insecurity: Not on file  Transportation Needs: Not on file  Physical Activity: Not on file  Stress: Not on file  Social Connections: Not on file  Intimate Partner Violence: Not on file    No outpatient medications prior to visit.   No facility-administered medications prior to visit.    No Known Allergies  ROS Review of Systems  Constitutional: Negative for activity change, appetite change and unexpected weight change.  HENT: Positive for hearing loss. Negative for congestion.   Eyes: Negative for visual disturbance.  Respiratory: Negative for apnea and shortness of breath.   Cardiovascular: Negative for chest pain.  Gastrointestinal: Negative for abdominal pain and blood in stool.  Endocrine: Negative for polydipsia, polyphagia and polyuria.  Genitourinary: Negative for decreased urine volume and difficulty urinating.  Musculoskeletal: Positive for arthralgias (chronic right knee pain).  Skin: Negative for rash.  Neurological: Negative for dizziness, seizures, weakness and headaches.  Psychiatric/Behavioral: Negative for sleep disturbance and suicidal ideas.      Objective:    Physical Exam Vitals and nursing note reviewed.  Constitutional:      General: He is not in acute distress.    Appearance: Normal appearance. He is not toxic-appearing.  HENT:  Head: Normocephalic and atraumatic.     Right Ear: Tympanic membrane, ear canal and external ear normal. Decreased hearing noted.     Left Ear: Tympanic membrane, ear canal and external ear normal. Decreased hearing noted.     Nose: Nose normal.     Mouth/Throat:     Mouth: Mucous membranes are moist.     Pharynx: Oropharynx is clear.  Eyes:     Extraocular Movements: Extraocular movements intact.     Conjunctiva/sclera: Conjunctivae normal.     Pupils: Pupils are equal, round, and reactive to light.  Cardiovascular:     Rate and Rhythm: Normal rate and regular rhythm.      Pulses: Normal pulses.     Heart sounds: Normal heart sounds.  Pulmonary:     Effort: Pulmonary effort is normal.     Breath sounds: Normal breath sounds.  Abdominal:     General: Abdomen is flat. Bowel sounds are normal.     Palpations: Abdomen is soft.     Tenderness: There is no abdominal tenderness.  Musculoskeletal:        General: Normal range of motion.     Cervical back: Normal range of motion and neck supple.     Comments: R KNEE IN BRACE  Skin:    General: Skin is warm and dry.  Neurological:     General: No focal deficit present.     Mental Status: He is alert and oriented to person, place, and time.  Psychiatric:        Mood and Affect: Mood normal.        Behavior: Behavior normal.     BP (!) 157/85   Pulse 73   Temp 97.6 F (36.4 C)   Ht 5\' 10"  (1.778 m)   Wt 248 lb (112.5 kg)   SpO2 99%   BMI 35.58 kg/m  Wt Readings from Last 3 Encounters:  10/16/20 248 lb (112.5 kg)  09/18/20 250 lb (113.4 kg)  09/17/15 243 lb 3.2 oz (110.3 kg)     Health Maintenance Due  Topic Date Due  . Hepatitis C Screening  Never done  . COLONOSCOPY (Pts 45-66yrs Insurance coverage will need to be confirmed)  Never done     Assessment & Plan:   Problem List Items Addressed This Visit   None   Visit Diagnoses    Encounter for annual physical exam    -  Primary   Screening for colon cancer       Screening for cholesterol level       Diabetes mellitus screening       Essential hypertension       Screening for HIV (human immunodeficiency virus)       Need for hepatitis C screening test       Primary osteoarthritis of right knee          No orders of the defined types were placed in this encounter.   Follow-up: Return in about 3 weeks (around 11/06/2020) for blood pressure check.   1. Encounter for annual physical exam 2. Screening for colon cancer 3. Screening for cholesterol level 4. Diabetes mellitus screening 5. Screening for HIV (human immunodeficiency  virus) 6. Need for hepatitis C screening test Age-appropriate screening and counseling performed today. Will check labs and call with results. Referral sent for screening colonoscopy. He will look into Shingrix vaccine with pharmacy. He will continue to walk as tolerated and work on healthy eating. PSA screening guidelines discussed with patient  and elected to not test at this time.  7. Essential hypertension Still elevated, never has been treated. Risk vs benefits discussed with patient. Start on Losartan 25 mg daily. Monitor at home (recommend home BP testing). Limit salt and drink plenty of water. Will recheck in 3-4 weeks in office and adjust accordingly.  8. Primary osteoarthritis of right knee He declines any additional help for this right now; managing with his brace.  This visit occurred during the SARS-CoV-2 public health emergency.  Safety protocols were in place, including screening questions prior to the visit, additional usage of staff PPE, and extensive cleaning of exam room while observing appropriate contact time as indicated for disinfecting solutions.    Zyir Gassert M Bradon Fester, PA-C

## 2020-10-17 LAB — HIV ANTIBODY (ROUTINE TESTING W REFLEX): HIV 1&2 Ab, 4th Generation: NONREACTIVE

## 2020-10-17 LAB — HEPATITIS C ANTIBODY
Hepatitis C Ab: NONREACTIVE
SIGNAL TO CUT-OFF: 0.01 (ref <1.00)

## 2020-11-09 ENCOUNTER — Other Ambulatory Visit: Payer: Self-pay

## 2020-11-09 ENCOUNTER — Ambulatory Visit (INDEPENDENT_AMBULATORY_CARE_PROVIDER_SITE_OTHER): Payer: BC Managed Care – PPO | Admitting: Physician Assistant

## 2020-11-09 ENCOUNTER — Encounter: Payer: Self-pay | Admitting: Physician Assistant

## 2020-11-09 VITALS — BP 132/72 | HR 74 | Temp 98.0°F | Ht 70.0 in | Wt 244.0 lb

## 2020-11-09 DIAGNOSIS — I10 Essential (primary) hypertension: Secondary | ICD-10-CM | POA: Diagnosis not present

## 2020-11-09 MED ORDER — LOSARTAN POTASSIUM 25 MG PO TABS: 25.0000 mg | ORAL_TABLET | Freq: Every day | ORAL | 3 refills | Status: DC

## 2020-11-09 NOTE — Patient Instructions (Signed)
Your blood pressure looks good!  Continue on Losartan 25 mg daily. This was refilled today. Keep drinking plenty of water and limit your salt intake. Recheck in 4-6 months. Call sooner if any problems.     Hypertension, Adult Hypertension is another name for high blood pressure. High blood pressure forces your heart to work harder to pump blood. This can cause problems over time. There are two numbers in a blood pressure reading. There is a top number (systolic) over a bottom number (diastolic). It is best to have a blood pressure that is below 120/80. Healthy choices can help lower your blood pressure, or you may need medicine to help lower it. What are the causes? The cause of this condition is not known. Some conditions may be related to high blood pressure. What increases the risk?  Smoking.  Having type 2 diabetes mellitus, high cholesterol, or both.  Not getting enough exercise or physical activity.  Being overweight.  Having too much fat, sugar, calories, or salt (sodium) in your diet.  Drinking too much alcohol.  Having long-term (chronic) kidney disease.  Having a family history of high blood pressure.  Age. Risk increases with age.  Race. You may be at higher risk if you are African American.  Gender. Men are at higher risk than women before age 56. After age 70, women are at higher risk than men.  Having obstructive sleep apnea.  Stress. What are the signs or symptoms?  High blood pressure may not cause symptoms. Very high blood pressure (hypertensive crisis) may cause: ? Headache. ? Feelings of worry or nervousness (anxiety). ? Shortness of breath. ? Nosebleed. ? A feeling of being sick to your stomach (nausea). ? Throwing up (vomiting). ? Changes in how you see. ? Very bad chest pain. ? Seizures. How is this treated?  This condition is treated by making healthy lifestyle changes, such as: ? Eating healthy foods. ? Exercising more. ? Drinking less  alcohol.  Your health care provider may prescribe medicine if lifestyle changes are not enough to get your blood pressure under control, and if: ? Your top number is above 130. ? Your bottom number is above 80.  Your personal target blood pressure may vary. Follow these instructions at home: Eating and drinking  If told, follow the DASH eating plan. To follow this plan: ? Fill one half of your plate at each meal with fruits and vegetables. ? Fill one fourth of your plate at each meal with whole grains. Whole grains include whole-wheat pasta, brown rice, and whole-grain bread. ? Eat or drink low-fat dairy products, such as skim milk or low-fat yogurt. ? Fill one fourth of your plate at each meal with low-fat (lean) proteins. Low-fat proteins include fish, chicken without skin, eggs, beans, and tofu. ? Avoid fatty meat, cured and processed meat, or chicken with skin. ? Avoid pre-made or processed food.  Eat less than 1,500 mg of salt each day.  Do not drink alcohol if: ? Your doctor tells you not to drink. ? You are pregnant, may be pregnant, or are planning to become pregnant.  If you drink alcohol: ? Limit how much you use to:  0-1 drink a day for women.  0-2 drinks a day for men. ? Be aware of how much alcohol is in your drink. In the [place] one drink equals one 12 oz bottle of beer (355 mL), one 5 oz glass of wine (148 mL), or one 1 oz glass of hard liquor (  44 mL).   Lifestyle  Work with your doctor to stay at a healthy weight or to lose weight. Ask your doctor what the best weight is for you.  Get at least 30 minutes of exercise most days of the week. This may include walking, swimming, or biking.  Get at least 30 minutes of exercise that strengthens your muscles (resistance exercise) at least 3 days a week. This may include lifting weights or doing Pilates.  Do not use any products that contain nicotine or tobacco, such as cigarettes, e-cigarettes, and chewing tobacco. If  you need help quitting, ask your doctor.  Check your blood pressure at home as told by your doctor.  Keep all follow-up visits as told by your doctor. This is important.   Medicines  Take over-the-counter and prescription medicines only as told by your doctor. Follow directions carefully.  Do not skip doses of blood pressure medicine. The medicine does not work as well if you skip doses. Skipping doses also puts you at risk for problems.  Ask your doctor about side effects or reactions to medicines that you should watch for. Contact a doctor if you:  Think you are having a reaction to the medicine you are taking.  Have headaches that keep coming back (recurring).  Feel dizzy.  Have swelling in your ankles.  Have trouble with your vision. Get help right away if you:  Get a very bad headache.  Start to feel mixed up (confused).  Feel weak or numb.  Feel faint.  Have very bad pain in your: ? Chest. ? Belly (abdomen).  Throw up more than once.  Have trouble breathing. Summary  Hypertension is another name for high blood pressure.  High blood pressure forces your heart to work harder to pump blood.  For most people, a normal blood pressure is less than 120/80.  Making healthy choices can help lower blood pressure. If your blood pressure does not get lower with healthy choices, you may need to take medicine. This information is not intended to replace advice given to you by your health care provider. Make sure you discuss any questions you have with your health care provider. Document Revised: 03/31/2018 Document Reviewed: 03/31/2018 Elsevier Patient Education  2021 [employer].

## 2020-11-09 NOTE — Progress Notes (Signed)
Established Patient Office Visit  Subjective:  Patient ID: Kenneth Snyder, male    DOB: Feb 27, 1956  Age: 65 y.o. MRN: [id number]  CC:  Chief Complaint  Patient presents with  . Hypertension    HPI Kenneth Snyder presents for blood pressure recheck. He has been taking Losartan 25 mg daily. Denies any side effects from the medication. He has not been checking his BP at home. Says he feels good other than his R knee arthritis.   Past Medical History:  Diagnosis Date  . Allergy     Past Surgical History:  Procedure Laterality Date  . DG KNEE RIGHT COMPLETE ([place] HX) Right 2018  . TENDON REPAIR Left arm    Family History  Problem Relation Age of Onset  . Cancer Mother   . ALS Mother   . Cancer Father   . ALS Sister   . ALS Brother     Social History   Socioeconomic History  . Marital status: Married    Spouse name: Not on file  . Number of children: Not on file  . Years of education: Not on file  . Highest education level: Not on file  Occupational History  . Not on file  Tobacco Use  . Smoking status: Never Smoker  . Smokeless tobacco: Never Used  Vaping Use  . Vaping Use: Never used  Substance and Sexual Activity  . Alcohol use: Yes    Alcohol/week: 8.0 standard drinks    Types: 8 Cans of beer per week  . Drug use: Yes    Types: Marijuana  . Sexual activity: Yes    Birth control/protection: None  Other Topics Concern  . Not on file  Social History Narrative  . Not on file   Social Determinants of Health   Financial Resource Strain: Not on file  Food Insecurity: Not on file  Transportation Needs: Not on file  Physical Activity: Not on file  Stress: Not on file  Social Connections: Not on file  Intimate Partner Violence: Not on file    Outpatient Medications Prior to Visit  Medication Sig Dispense Refill  . losartan (COZAAR) 25 MG tablet Take 1 tablet (25 mg total) by mouth daily. 30 tablet 0   No facility-administered medications prior to  visit.    No Known Allergies  ROS Review of Systems Negative ROS   Objective:    Physical Exam Vitals and nursing note reviewed.  Constitutional:      Appearance: Normal appearance.  Cardiovascular:     Rate and Rhythm: Normal rate and regular rhythm.     Pulses: Normal pulses.     Heart sounds: No murmur heard.   Pulmonary:     Effort: Pulmonary effort is normal.     Breath sounds: Normal breath sounds.  Neurological:     Mental Status: He is alert.  Psychiatric:        Mood and Affect: Mood normal.        Behavior: Behavior normal.        Thought Content: Thought content normal.     BP 132/72   Pulse 74   Temp 98 F (36.7 C)   Ht 5\' 10"  (1.778 m)   Wt 244 lb (110.7 kg)   SpO2 98%   BMI 35.01 kg/m  Wt Readings from Last 3 Encounters:  11/09/20 244 lb (110.7 kg)  10/16/20 248 lb (112.5 kg)  09/18/20 250 lb (113.4 kg)     Health Maintenance Due  Topic  Date Due  . COLONOSCOPY (Pts 45-37yrs Insurance coverage will need to be confirmed)  Never done    There are no preventive care reminders to display for this patient.  No results found for: TSH Lab Results  Component Value Date   WBC 8.8 10/16/2020   HGB 16.1 10/16/2020   HCT 46.6 10/16/2020   MCV 90.1 10/16/2020   PLT 233.0 10/16/2020   Lab Results  Component Value Date   NA 139 10/16/2020   K 4.2 10/16/2020   CO2 25 10/16/2020   GLUCOSE 103 (H) 10/16/2020   BUN 11 10/16/2020   CREATININE 0.92 10/16/2020   BILITOT 0.7 10/16/2020   ALKPHOS 83 10/16/2020   AST 24 10/16/2020   ALT 33 10/16/2020   PROT 7.3 10/16/2020   ALBUMIN 4.2 10/16/2020   CALCIUM 9.4 10/16/2020   GFR 88.31 10/16/2020   Lab Results  Component Value Date   CHOL 192 10/16/2020   Lab Results  Component Value Date   HDL 43.10 10/16/2020   Lab Results  Component Value Date   LDLCALC 125 (H) 10/16/2020   Lab Results  Component Value Date   TRIG 121.0 10/16/2020   Lab Results  Component Value Date   CHOLHDL 4  10/16/2020   No results found for: HGBA1C    Assessment & Plan:   Problem List Items Addressed This Visit   None   Visit Diagnoses    Essential hypertension    -  Primary   Relevant Medications   losartan (COZAAR) 25 MG tablet      Meds ordered this encounter  Medications  . losartan (COZAAR) 25 MG tablet    Sig: Take 1 tablet (25 mg total) by mouth daily.    Dispense:  90 tablet    Refill:  3    Follow-up: Return in about 4 months (around 03/11/2021) for Blood pressure check .   1. Essential hypertension He is doing well with Losartan 25 mg. Will continue medication at this dose. Encouraged him to have a cuff at home to check intermittently. Will recheck in about 4-6 months to make sure he is still doing well.  This visit occurred during the SARS-CoV-2 public health emergency.  Safety protocols were in place, including screening questions prior to the visit, additional usage of staff PPE, and extensive cleaning of exam room while observing appropriate contact time as indicated for disinfecting solutions.    Kalley Nicholl M Legrande Hao, PA-C

## 2021-02-20 ENCOUNTER — Ambulatory Visit (INDEPENDENT_AMBULATORY_CARE_PROVIDER_SITE_OTHER): Payer: Medicare HMO | Admitting: Physician Assistant

## 2021-02-20 ENCOUNTER — Other Ambulatory Visit: Payer: Self-pay

## 2021-02-20 ENCOUNTER — Encounter: Payer: Self-pay | Admitting: Physician Assistant

## 2021-02-20 VITALS — BP 146/97 | HR 70 | Temp 97.0°F | Ht 70.0 in | Wt 243.0 lb

## 2021-02-20 DIAGNOSIS — I10 Essential (primary) hypertension: Secondary | ICD-10-CM | POA: Insufficient documentation

## 2021-02-20 DIAGNOSIS — L918 Other hypertrophic disorders of the skin: Secondary | ICD-10-CM | POA: Diagnosis not present

## 2021-02-20 NOTE — Patient Instructions (Signed)
Referral to ophthalmology for L upper eyelid skin tag removal Elevated BP - need to take medication daily and please monitor at home. Call back if still in 140s/90s. Goal is 130/80 or lower.

## 2021-02-20 NOTE — Progress Notes (Signed)
Acute Office Visit  Subjective:    Patient ID: Kenneth Snyder, male    DOB: 1956-05-14, 65 y.o.   MRN: [id number]  Chief Complaint  Patient presents with   Skin Tag    HPI Patient is in today for L upper eyelid skin tag removal. No pain or obstruction of vision. No redness or signs of infection.  Past Medical History:  Diagnosis Date   Allergy     Past Surgical History:  Procedure Laterality Date   DG KNEE RIGHT COMPLETE ([place] HX) Right 2018   TENDON REPAIR Left arm    Family History  Problem Relation Age of Onset   Cancer Mother    ALS Mother    Cancer Father    ALS Sister    ALS Brother     Social History   Socioeconomic History   Marital status: Married    Spouse name: Not on file   Number of children: Not on file   Years of education: Not on file   Highest education level: Not on file  Occupational History   Not on file  Tobacco Use   Smoking status: Never   Smokeless tobacco: Never  Vaping Use   Vaping Use: Never used  Substance and Sexual Activity   Alcohol use: Yes    Alcohol/week: 8.0 standard drinks    Types: 8 Cans of beer per week   Drug use: Yes    Types: Marijuana   Sexual activity: Yes    Birth control/protection: None  Other Topics Concern   Not on file  Social History Narrative   Not on file   Social Determinants of Health   Financial Resource Strain: Not on file  Food Insecurity: Not on file  Transportation Needs: Not on file  Physical Activity: Not on file  Stress: Not on file  Social Connections: Not on file  Intimate Partner Violence: Not on file    Outpatient Medications Prior to Visit  Medication Sig Dispense Refill   losartan (COZAAR) 25 MG tablet Take 1 tablet (25 mg total) by mouth daily. 90 tablet 3   No facility-administered medications prior to visit.    No Known Allergies  Review of Systems REFER TO HPI FOR PERTINENT POSITIVES AND NEGATIVES     Objective:    Physical Exam Eyes:     BP (!) 146/97    Pulse 70   Temp (!) 97 F (36.1 C)   Ht 5\' 10"  (1.778 m)   Wt 243 lb (110.2 kg)   SpO2 98%   BMI 34.87 kg/m  Wt Readings from Last 3 Encounters:  02/20/21 243 lb (110.2 kg)  11/09/20 244 lb (110.7 kg)  10/16/20 248 lb (112.5 kg)    Health Maintenance Due  Topic Date Due   COLONOSCOPY (Pts 45-4yrs Insurance coverage will need to be confirmed)  Never done   Zoster Vaccines- Shingrix (1 of 2) Never done   COVID-19 Vaccine (4 - Booster for [place] series) 12/07/2020    There are no preventive care reminders to display for this patient.   No results found for: TSH Lab Results  Component Value Date   WBC 8.8 10/16/2020   HGB 16.1 10/16/2020   HCT 46.6 10/16/2020   MCV 90.1 10/16/2020   PLT 233.0 10/16/2020   Lab Results  Component Value Date   NA 139 10/16/2020   K 4.2 10/16/2020   CO2 25 10/16/2020   GLUCOSE 103 (H) 10/16/2020   BUN 11 10/16/2020  CREATININE 0.92 10/16/2020   BILITOT 0.7 10/16/2020   ALKPHOS 83 10/16/2020   AST 24 10/16/2020   ALT 33 10/16/2020   PROT 7.3 10/16/2020   ALBUMIN 4.2 10/16/2020   CALCIUM 9.4 10/16/2020   GFR 88.31 10/16/2020   Lab Results  Component Value Date   CHOL 192 10/16/2020   Lab Results  Component Value Date   HDL 43.10 10/16/2020   Lab Results  Component Value Date   LDLCALC 125 (H) 10/16/2020   Lab Results  Component Value Date   TRIG 121.0 10/16/2020   Lab Results  Component Value Date   CHOLHDL 4 10/16/2020   No results found for: HGBA1C     Assessment & Plan:   Problem List Items Addressed This Visit       Cardiovascular and Mediastinum   Essential hypertension   Other Visit Diagnoses     Skin tag    -  Primary   Relevant Orders   Ambulatory referral to Ophthalmology      1. Skin tag Referral to ophthalmology for removal given its size and location.  2. Essential hypertension Elevated today. He has not had Losartan 25 mg in a few days. Reminded him about compliance with medications.  He is going to buy a cuff to have at home and let me know about his home readings. DASH diet still recommended.    Rhet Rorke M Brianca Fortenberry, PA-C

## 2021-03-11 ENCOUNTER — Telehealth: Payer: Self-pay

## 2021-03-11 DIAGNOSIS — L918 Other hypertrophic disorders of the skin: Secondary | ICD-10-CM

## 2021-03-11 DIAGNOSIS — Z1211 Encounter for screening for malignant neoplasm of colon: Secondary | ICD-10-CM

## 2021-03-11 NOTE — Telephone Encounter (Signed)
Pt's wife called stating that Kenneth Snyder was supposed to send a referral for dermatology and a referral for his coloscopy. Can the referrals be resent?

## 2021-03-11 NOTE — Telephone Encounter (Signed)
Please see message and advise 

## 2021-03-11 NOTE — Telephone Encounter (Signed)
Spoke to Kenneth Snyder pt's wife told her referral to GI has been sent for colonoscopy and they wil contact you to schedule. Told her if you do not hear from someone in 2 weeks or so please contact their office. [name] verbalized understanding and asked about Dermatology referral. Told her referral was placed with Ophthalmology told her I will send to [facility]. Kenneth Snyder verbalized understanding.  Called Kenneth Snyder back told her the referral was placed for skin tag to Ophthalmology due to where shin tag is. Someone should be contacting about an appt. Kenneth Snyder verbalized understanding.

## 2021-04-02 DIAGNOSIS — D492 Neoplasm of unspecified behavior of bone, soft tissue, and skin: Secondary | ICD-10-CM | POA: Diagnosis not present

## 2021-04-02 DIAGNOSIS — L918 Other hypertrophic disorders of the skin: Secondary | ICD-10-CM | POA: Diagnosis not present

## 2021-04-02 DIAGNOSIS — I10 Essential (primary) hypertension: Secondary | ICD-10-CM | POA: Diagnosis not present

## 2021-05-01 DIAGNOSIS — D492 Neoplasm of unspecified behavior of bone, soft tissue, and skin: Secondary | ICD-10-CM | POA: Diagnosis not present

## 2021-05-01 DIAGNOSIS — Z9889 Other specified postprocedural states: Secondary | ICD-10-CM | POA: Diagnosis not present

## 2021-05-01 DIAGNOSIS — I10 Essential (primary) hypertension: Secondary | ICD-10-CM | POA: Diagnosis not present

## 2021-06-13 DIAGNOSIS — M1711 Unilateral primary osteoarthritis, right knee: Secondary | ICD-10-CM | POA: Diagnosis not present

## 2021-08-08 DIAGNOSIS — M1711 Unilateral primary osteoarthritis, right knee: Secondary | ICD-10-CM | POA: Diagnosis not present

## 2021-08-19 ENCOUNTER — Ambulatory Visit (INDEPENDENT_AMBULATORY_CARE_PROVIDER_SITE_OTHER): Payer: Medicare HMO | Admitting: Physician Assistant

## 2021-08-19 ENCOUNTER — Encounter: Payer: Self-pay | Admitting: Physician Assistant

## 2021-08-19 ENCOUNTER — Other Ambulatory Visit: Payer: Self-pay

## 2021-08-19 VITALS — BP 139/76 | HR 69 | Temp 98.0°F | Ht 70.0 in | Wt 251.0 lb

## 2021-08-19 DIAGNOSIS — I10 Essential (primary) hypertension: Secondary | ICD-10-CM | POA: Diagnosis not present

## 2021-08-19 DIAGNOSIS — Z13220 Encounter for screening for lipoid disorders: Secondary | ICD-10-CM | POA: Diagnosis not present

## 2021-08-19 DIAGNOSIS — Z131 Encounter for screening for diabetes mellitus: Secondary | ICD-10-CM | POA: Diagnosis not present

## 2021-08-19 DIAGNOSIS — Z01818 Encounter for other preprocedural examination: Secondary | ICD-10-CM | POA: Diagnosis not present

## 2021-08-19 LAB — CBC WITH DIFFERENTIAL/PLATELET
Basophils Absolute: 0.1 10*3/uL (ref 0.0–0.1)
Basophils Relative: 1 % (ref 0.0–3.0)
Eosinophils Absolute: 0.3 10*3/uL (ref 0.0–0.7)
Eosinophils Relative: 3.8 % (ref 0.0–5.0)
HCT: 49.7 % (ref 39.0–52.0)
Hemoglobin: 16.4 g/dL (ref 13.0–17.0)
Lymphocytes Relative: 28.4 % (ref 12.0–46.0)
Lymphs Abs: 2.3 10*3/uL (ref 0.7–4.0)
MCHC: 32.9 g/dL (ref 30.0–36.0)
MCV: 93.3 fl (ref 78.0–100.0)
Monocytes Absolute: 0.6 10*3/uL (ref 0.1–1.0)
Monocytes Relative: 7.1 % (ref 3.0–12.0)
Neutro Abs: 4.9 10*3/uL (ref 1.4–7.7)
Neutrophils Relative %: 59.7 % (ref 43.0–77.0)
Platelets: 262 10*3/uL (ref 150.0–400.0)
RBC: 5.33 Mil/uL (ref 4.22–5.81)
RDW: 14.2 % (ref 11.5–15.5)
WBC: 8.1 10*3/uL (ref 4.0–10.5)

## 2021-08-19 LAB — COMPREHENSIVE METABOLIC PANEL
ALT: 39 U/L (ref 0–53)
AST: 26 U/L (ref 0–37)
Albumin: 4.4 g/dL (ref 3.5–5.2)
Alkaline Phosphatase: 85 U/L (ref 39–117)
BUN: 13 mg/dL (ref 6–23)
CO2: 27 mEq/L (ref 19–32)
Calcium: 9.9 mg/dL (ref 8.4–10.5)
Chloride: 105 mEq/L (ref 96–112)
Creatinine, Ser: 1.05 mg/dL (ref 0.40–1.50)
GFR: 74.92 mL/min (ref >60.00)
Glucose, Bld: 107 mg/dL — ABNORMAL HIGH (ref 70–99)
Potassium: 4.7 mEq/L (ref 3.5–5.1)
Sodium: 142 mEq/L (ref 135–145)
Total Bilirubin: 0.5 mg/dL (ref 0.2–1.2)
Total Protein: 7.9 g/dL (ref 6.0–8.3)

## 2021-08-19 LAB — LIPID PANEL
Cholesterol: 200 mg/dL (ref 0–200)
HDL: 49.8 mg/dL (ref >39.00)
LDL Cholesterol: 131 mg/dL — ABNORMAL HIGH (ref 0–99)
NonHDL: 150.36
Total CHOL/HDL Ratio: 4
Triglycerides: 96 mg/dL (ref 0.0–149.0)
VLDL: 19.2 mg/dL (ref 0.0–40.0)

## 2021-08-19 LAB — HEMOGLOBIN A1C: Hgb A1c MFr Bld: 5.8 % (ref 4.6–6.5)

## 2021-08-19 NOTE — Patient Instructions (Signed)
EKG looks great. Please have labs drawn today and I will call with results. Should be ok for surgery pending no surprises on labs.  Keep up good work!

## 2021-08-19 NOTE — Progress Notes (Signed)
Subjective:    Patient ID: Kenneth Snyder, male    DOB: 01-06-1956, 66 y.o.   MRN: [id number]  Chief Complaint  Patient presents with   Pre-op Exam    HPI Patient is in today for pre-op evaluation. He is going to schedule with Dr. Wynelle Link for right knee total replacement. Date of surgery is November 05, 2021.  He has no complaints or concerns today. Pt does not smoke. Prior lifetime surgeries include issues with knees & thumb - no problems with anesthesia. No family hx of issues with anesthesia.   No chest pain or shortness of breath. No issues with going up and down stairs other than pain in his R knee.   No hospitalizations or surgeries in the last year. No ED visits in the last year.   Pt takes his Losartan 25 mg daily as directed for his BP and denies any symptoms or problems. BP readings have been in 120s-130s/70s-80s.   Past Medical History:  Diagnosis Date   Allergy     Past Surgical History:  Procedure Laterality Date   DG KNEE RIGHT COMPLETE ([place] HX) Right 2018   TENDON REPAIR Left arm    Family History  Problem Relation Age of Onset   Cancer Mother    ALS Mother    Cancer Father    ALS Sister    ALS Brother     Social History   Tobacco Use   Smoking status: Never   Smokeless tobacco: Never  Vaping Use   Vaping Use: Never used  Substance Use Topics   Alcohol use: Yes    Alcohol/week: 8.0 standard drinks    Types: 8 Cans of beer per week   Drug use: Yes    Types: Marijuana     No Known Allergies  Review of Systems NEGATIVE UNLESS OTHERWISE INDICATED IN HPI      Objective:     BP 139/76    Pulse 69    Temp 98 F (36.7 C)    Ht 5\' 10"  (1.778 m)    Wt 251 lb (113.9 kg)    SpO2 98%    BMI 36.01 kg/m   Wt Readings from Last 3 Encounters:  08/19/21 251 lb (113.9 kg)  02/20/21 243 lb (110.2 kg)  11/09/20 244 lb (110.7 kg)    BP Readings from Last 3 Encounters:  08/19/21 139/76  02/20/21 (!) 146/97  11/09/20 132/72     Physical  Exam Vitals and nursing note reviewed.  Constitutional:      General: He is not in acute distress.    Appearance: Normal appearance. He is not toxic-appearing.  HENT:     Head: Normocephalic and atraumatic.     Right Ear: External ear normal.     Left Ear: External ear normal.     Nose: Nose normal.     Mouth/Throat:     Mouth: Mucous membranes are moist.     Pharynx: Oropharynx is clear.  Eyes:     Extraocular Movements: Extraocular movements intact.     Conjunctiva/sclera: Conjunctivae normal.     Pupils: Pupils are equal, round, and reactive to light.  Cardiovascular:     Rate and Rhythm: Normal rate and regular rhythm.     Pulses: Normal pulses.     Heart sounds: Normal heart sounds.  Pulmonary:     Effort: Pulmonary effort is normal.     Breath sounds: Normal breath sounds.  Musculoskeletal:        General:  Normal range of motion.     Cervical back: Normal range of motion and neck supple.  Skin:    General: Skin is warm and dry.  Neurological:     General: No focal deficit present.     Mental Status: He is alert and oriented to person, place, and time.  Psychiatric:        Mood and Affect: Mood normal.        Behavior: Behavior normal.       Assessment & Plan:   Problem List Items Addressed This Visit       Cardiovascular and Mediastinum   Essential hypertension   Relevant Medications   losartan (COZAAR) 25 MG tablet   Other Relevant Orders   Comprehensive metabolic panel   Other Visit Diagnoses     Pre-op exam    -  Primary   Relevant Orders   EKG 12-Lead (Completed)   CBC with Differential/Platelet   Comprehensive metabolic panel   Lipid panel   Diabetes mellitus screening       Relevant Orders   Comprehensive metabolic panel   Hemoglobin A1c   Screening for cholesterol level       Relevant Orders   Lipid panel       1. Pre-op exam 2. Essential hypertension 3. Diabetes mellitus screening 4. Screening for cholesterol level -Pre-op  evaluation today for elective knee surgery. Low-risk candidate based on RCRI Score: Points 0: Class I Very Low (8.2% complications). -Plan to update labs today to ensure no changes in the last year, he is fasting -EKG per my read was in NSR with 72 bpm, no ST or T wave changes.  -BP stable on Losartan 25 mg. Continue this daily. Low salt diet. Monitor readings.  F/up with me in 6 months or prn    In addition to time spent for ekg, I spent 31 minutes of total time on the date of the encounter performing the following actions: chart review prior to seeing the patient, obtaining history, performing a medically necessary exam, counseling on the treatment plan, placing orders, and documenting in our EHR.     Annalie Wenner M Rasheeda Mulvehill, PA-C

## 2021-11-06 DIAGNOSIS — M1711 Unilateral primary osteoarthritis, right knee: Secondary | ICD-10-CM | POA: Diagnosis not present

## 2021-11-06 DIAGNOSIS — M25561 Pain in right knee: Secondary | ICD-10-CM | POA: Diagnosis not present

## 2021-11-06 DIAGNOSIS — M25661 Stiffness of right knee, not elsewhere classified: Secondary | ICD-10-CM | POA: Diagnosis not present

## 2021-11-21 DIAGNOSIS — Z0189 Encounter for other specified special examinations: Secondary | ICD-10-CM | POA: Diagnosis not present

## 2021-11-21 DIAGNOSIS — Z79899 Other long term (current) drug therapy: Secondary | ICD-10-CM | POA: Diagnosis not present

## 2021-12-04 ENCOUNTER — Telehealth: Payer: Self-pay | Admitting: Physician Assistant

## 2021-12-04 NOTE — Telephone Encounter (Signed)
Spoke with spouse she stated patient having surgery next week.  Req CB in June 2023 ?

## 2021-12-10 DIAGNOSIS — Z96651 Presence of right artificial knee joint: Secondary | ICD-10-CM | POA: Diagnosis not present

## 2021-12-10 DIAGNOSIS — M1711 Unilateral primary osteoarthritis, right knee: Secondary | ICD-10-CM | POA: Diagnosis not present

## 2021-12-10 DIAGNOSIS — G8918 Other acute postprocedural pain: Secondary | ICD-10-CM | POA: Diagnosis not present

## 2021-12-16 DIAGNOSIS — M25561 Pain in right knee: Secondary | ICD-10-CM | POA: Diagnosis not present

## 2021-12-18 DIAGNOSIS — M25561 Pain in right knee: Secondary | ICD-10-CM | POA: Diagnosis not present

## 2021-12-20 DIAGNOSIS — M25561 Pain in right knee: Secondary | ICD-10-CM | POA: Diagnosis not present

## 2021-12-23 DIAGNOSIS — M25561 Pain in right knee: Secondary | ICD-10-CM | POA: Diagnosis not present

## 2021-12-25 DIAGNOSIS — M25561 Pain in right knee: Secondary | ICD-10-CM | POA: Diagnosis not present

## 2021-12-27 DIAGNOSIS — M25561 Pain in right knee: Secondary | ICD-10-CM | POA: Diagnosis not present

## 2021-12-31 DIAGNOSIS — M25561 Pain in right knee: Secondary | ICD-10-CM | POA: Diagnosis not present

## 2021-12-31 DIAGNOSIS — M25661 Stiffness of right knee, not elsewhere classified: Secondary | ICD-10-CM | POA: Diagnosis not present

## 2022-01-02 ENCOUNTER — Ambulatory Visit (INDEPENDENT_AMBULATORY_CARE_PROVIDER_SITE_OTHER): Payer: Medicare HMO

## 2022-01-02 DIAGNOSIS — Z1211 Encounter for screening for malignant neoplasm of colon: Secondary | ICD-10-CM | POA: Diagnosis not present

## 2022-01-02 DIAGNOSIS — Z0000 Encounter for general adult medical examination without abnormal findings: Secondary | ICD-10-CM

## 2022-01-02 DIAGNOSIS — M25661 Stiffness of right knee, not elsewhere classified: Secondary | ICD-10-CM | POA: Diagnosis not present

## 2022-01-02 DIAGNOSIS — M25561 Pain in right knee: Secondary | ICD-10-CM | POA: Diagnosis not present

## 2022-01-02 NOTE — Patient Instructions (Signed)
Kenneth Snyder , Thank you for taking time to come for your Medicare Wellness Visit. I appreciate your ongoing commitment to your health goals. Please review the following plan we discussed and let me know if I can assist you in the future.   Screening recommendations/referrals: Colonoscopy: order placed 01/02/22 Recommended yearly ophthalmology/optometry visit for glaucoma screening and checkup Recommended yearly dental visit for hygiene and checkup  Vaccinations: Influenza vaccine: Due Pneumococcal vaccine: Due Tdap vaccine: Done 02/10/14 repeat every 10 years  Shingles vaccine: Shingrix discussed. Please contact your pharmacy for coverage information.    Covid-19: Completed 3/14, 11/07/19, 08/09/20  Advanced directives: Advance directive discussed with you today. Even though you declined this today please call our office should you change your mind and we can give you the proper paperwork for you to fill out.  Conditions/risks identified: Lose weight   Next appointment: Follow up in one year for your annual wellness visit.   Preventive Care 37 Years and Older, Male Preventive care refers to lifestyle choices and visits with your health care provider that can promote health and wellness. What does preventive care include? A yearly physical exam. This is also called an annual well check. Dental exams once or twice a year. Routine eye exams. Ask your health care provider how often you should have your eyes checked. Personal lifestyle choices, including: Daily care of your teeth and gums. Regular physical activity. Eating a healthy diet. Avoiding tobacco and drug use. Limiting alcohol use. Practicing safe sex. Taking low doses of aspirin every day. Taking vitamin and mineral supplements as recommended by your health care provider. What happens during an annual well check? The services and screenings done by your health care provider during your annual well check will depend on your age,  overall health, lifestyle risk factors, and family history of disease. Counseling  Your health care provider may ask you questions about your: Alcohol use. Tobacco use. Drug use. Emotional well-being. Home and relationship well-being. Sexual activity. Eating habits. History of falls. Memory and ability to understand (cognition). Work and work Statistician. Screening  You may have the following tests or measurements: Height, weight, and BMI. Blood pressure. Lipid and cholesterol levels. These may be checked every 5 years, or more frequently if you are over 40 years old. Skin check. Lung cancer screening. You may have this screening every year starting at age 58 if you have a 30-pack-year history of smoking and currently smoke or have quit within the past 15 years. Fecal occult blood test (FOBT) of the stool. You may have this test every year starting at age 74. Flexible sigmoidoscopy or colonoscopy. You may have a sigmoidoscopy every 5 years or a colonoscopy every 10 years starting at age 73. Prostate cancer screening. Recommendations will vary depending on your family history and other risks. Hepatitis C blood test. Hepatitis B blood test. Sexually transmitted disease (STD) testing. Diabetes screening. This is done by checking your blood sugar (glucose) after you have not eaten for a while (fasting). You may have this done every 1-3 years. Abdominal aortic aneurysm (AAA) screening. You may need this if you are a current or former smoker. Osteoporosis. You may be screened starting at age 39 if you are at high risk. Talk with your health care provider about your test results, treatment options, and if necessary, the need for more tests. Vaccines  Your health care provider may recommend certain vaccines, such as: Influenza vaccine. This is recommended every year. Tetanus, diphtheria, and acellular pertussis (Tdap,  Td) vaccine. You may need a Td booster every 10 years. Zoster vaccine.  You may need this after age 25. Pneumococcal 13-valent conjugate (PCV13) vaccine. One dose is recommended after age 34. Pneumococcal polysaccharide (PPSV23) vaccine. One dose is recommended after age 63. Talk to your health care provider about which screenings and vaccines you need and how often you need them. This information is not intended to replace advice given to you by your health care provider. Make sure you discuss any questions you have with your health care provider. Document Released: 08/17/2015 Document Revised: 04/09/2016 Document Reviewed: 05/22/2015 Elsevier Interactive Patient Education  2017 [place] Prevention in the Home Falls can cause injuries. They can happen to people of all ages. There are many things you can do to make your home safe and to help prevent falls. What can I do on the outside of my home? Regularly fix the edges of walkways and driveways and fix any cracks. Remove anything that might make you trip as you walk through a door, such as a raised step or threshold. Trim any bushes or trees on the path to your home. Use bright outdoor lighting. Clear any walking paths of anything that might make someone trip, such as rocks or tools. Regularly check to see if handrails are loose or broken. Make sure that both sides of any steps have handrails. Any raised decks and porches should have guardrails on the edges. Have any leaves, snow, or ice cleared regularly. Use sand or salt on walking paths during winter. Clean up any spills in your garage right away. This includes oil or grease spills. What can I do in the bathroom? Use night lights. Install grab bars by the toilet and in the tub and shower. Do not use towel bars as grab bars. Use non-skid mats or decals in the tub or shower. If you need to sit down in the shower, use a plastic, non-slip stool. Keep the floor dry. Clean up any water that spills on the floor as soon as it happens. Remove soap  buildup in the tub or shower regularly. Attach bath mats securely with double-sided non-slip rug tape. Do not have throw rugs and other things on the floor that can make you trip. What can I do in the bedroom? Use night lights. Make sure that you have a light by your bed that is easy to reach. Do not use any sheets or blankets that are too big for your bed. They should not hang down onto the floor. Have a firm chair that has side arms. You can use this for support while you get dressed. Do not have throw rugs and other things on the floor that can make you trip. What can I do in the kitchen? Clean up any spills right away. Avoid walking on wet floors. Keep items that you use a lot in easy-to-reach places. If you need to reach something above you, use a strong step stool that has a grab bar. Keep electrical cords out of the way. Do not use floor polish or wax that makes floors slippery. If you must use wax, use non-skid floor wax. Do not have throw rugs and other things on the floor that can make you trip. What can I do with my stairs? Do not leave any items on the stairs. Make sure that there are handrails on both sides of the stairs and use them. Fix handrails that are broken or loose. Make sure that handrails are as  long as the stairways. Check any carpeting to make sure that it is firmly attached to the stairs. Fix any carpet that is loose or worn. Avoid having throw rugs at the top or bottom of the stairs. If you do have throw rugs, attach them to the floor with carpet tape. Make sure that you have a light switch at the top of the stairs and the bottom of the stairs. If you do not have them, ask someone to add them for you. What else can I do to help prevent falls? Wear shoes that: Do not have high heels. Have rubber bottoms. Are comfortable and fit you well. Are closed at the toe. Do not wear sandals. If you use a stepladder: Make sure that it is fully opened. Do not climb a closed  stepladder. Make sure that both sides of the stepladder are locked into place. Ask someone to hold it for you, if possible. Clearly mark and make sure that you can see: Any grab bars or handrails. First and last steps. Where the edge of each step is. Use tools that help you move around (mobility aids) if they are needed. These include: Canes. Walkers. Scooters. Crutches. Turn on the lights when you go into a dark area. Replace any light bulbs as soon as they burn out. Set up your furniture so you have a clear path. Avoid moving your furniture around. If any of your floors are uneven, fix them. If there are any pets around you, be aware of where they are. Review your medicines with your doctor. Some medicines can make you feel dizzy. This can increase your chance of falling. Ask your doctor what other things that you can do to help prevent falls. This information is not intended to replace advice given to you by your health care provider. Make sure you discuss any questions you have with your health care provider. Document Released: 05/17/2009 Document Revised: 12/27/2015 Document Reviewed: 08/25/2014 Elsevier Interactive Patient Education  2017 [employer].

## 2022-01-02 NOTE — Progress Notes (Signed)
Virtual Visit via Telephone Note  I connected with  [name] on 01/02/22 at 11:45 AM EDT by telephone and verified that I am speaking with the correct person using two identifiers.  Medicare Annual Wellness visit completed telephonically due to Covid-19 pandemic.   Persons participating in this call: This Health Coach and this patient.   Location: Patient: Home Provider: Office    I discussed the limitations, risks, security and privacy concerns of performing an evaluation and management service by telephone and the availability of in person appointments. The patient expressed understanding and agreed to proceed.  Unable to perform video visit due to video visit attempted and failed and/or patient does not have video capability.   Some vital signs may be absent or patient reported.   Willette Brace, LPN   Subjective:   Kenneth Snyder is a 66 y.o. male who presents for an Initial Medicare Annual Wellness Visit.  Review of Systems     Cardiac Risk Factors include: advanced age (>50mn, >>4women);sedentary lifestyle;obesity (BMI >30kg/m2);hypertension;male gender;smoking/ tobacco exposure     Objective:    There were no vitals filed for this visit. There is no height or weight on file to calculate BMI.     01/02/2022   11:51 AM 08/29/2015    1:04 PM  Advanced Directives  Does Patient Have a Medical Advance Directive? No No  Would patient like information on creating a medical advance directive? No - Patient declined No - patient declined information    Current Medications (verified) Outpatient Encounter Medications as of 01/02/2022  Medication Sig   aspirin 325 MG tablet aspirin 325 mg tablet  Take 1 tablet by mouth twice a day for three weeks following surgery. Then take an 81 mg Aspirin once a day for three weeks. Then discontinue.   fexofenadine (ALLEGRA) 60 MG tablet Take 60 mg by mouth 2 (two) times daily.   levocetirizine (XYZAL) 5 MG tablet Take 5 mg by mouth  every evening.   losartan (COZAAR) 25 MG tablet Take 25 mg by mouth daily.   No facility-administered encounter medications on file as of 01/02/2022.    Allergies (verified) Patient has no known allergies.   History: Past Medical History:  Diagnosis Date   Allergy    Past Surgical History:  Procedure Laterality Date   DG KNEE RIGHT COMPLETE ([place]) Right 2018   TENDON REPAIR Left arm   Family History  Problem Relation Age of Onset   Cancer Mother    ALS Mother    Cancer Father    ALS Sister    ALS Brother    Social History   Socioeconomic History   Marital status: Married    Spouse name: Not on file   Number of children: Not on file   Years of education: Not on file   Highest education level: Not on file  Occupational History   Not on file  Tobacco Use   Smoking status: Never   Smokeless tobacco: Never  Vaping Use   Vaping Use: Never used  Substance and Sexual Activity   Alcohol use: Yes    Alcohol/week: 8.0 standard drinks    Types: 8 Cans of beer per week   Drug use: Yes    Types: Marijuana   Sexual activity: Yes    Birth control/protection: None  Other Topics Concern   Not on file  Social History Narrative   Not on file   Social Determinants of HRadio broadcast assistant  Strain: Low Risk    Difficulty of Paying Living Expenses: Not hard at all  Food Insecurity: No Food Insecurity   Worried About Charity fundraiser in the Last Year: Never true   Ran Out of Food in the Last Year: Never true  Transportation Needs: No Transportation Needs   Lack of Transportation (Medical): No   Lack of Transportation (Non-Medical): No  Physical Activity: Inactive   Days of Exercise per Week: 0 days   Minutes of Exercise per Session: 0 min  Stress: No Stress Concern Present   Feeling of Stress : Not at all  Social Connections: Socially Isolated   Frequency of Communication with Friends and Family: Never   Frequency of Social Gatherings with Friends and Family:  Never   Attends Religious Services: Never   Marine scientist or Organizations: No   Attends Music therapist: Never   Marital Status: Married    Tobacco Counseling Counseling given: Not Answered   Clinical Intake:  Pre-visit preparation completed: Yes  Pain : No/denies pain     BMI - recorded: 36.01 Nutritional Status: BMI > 30  Obese Nutritional Risks: None Diabetes: No  How often do you need to have someone help you when you read instructions, pamphlets, or other written materials from your doctor or pharmacy?: 1 - Never  Diabetic?no  Interpreter Needed?: No  Information entered by :: Charlott Rakes, LPN   Activities of Daily Living    01/02/2022   11:53 AM  In your present state of health, do you have any difficulty performing the following activities:  Hearing? 1  Comment wears hearing aids  Vision? 0  Difficulty concentrating or making decisions? 0  Walking or climbing stairs? 0  Dressing or bathing? 0  Doing errands, shopping? 0  Preparing Food and eating ? N  Using the Toilet? N  In the past six months, have you accidently leaked urine? N  Do you have problems with loss of bowel control? N  Managing your Medications? N  Managing your Finances? N  Housekeeping or managing your Housekeeping? N    Patient Care Team: Allwardt, Randa Evens, PA-C as PCP - General (Physician Assistant)  Indicate any recent Medical Services you may have received from other than [facility] providers in the past year (date may be approximate).     Assessment:   This is a routine wellness examination for [name].  Hearing/Vision screen Hearing Screening - Comments:: Pt wears hearing aids  Vision Screening - Comments:: Pt follows up with Dr Joya San for annual eye exams   Dietary issues and exercise activities discussed: Current Exercise Habits: The patient does not participate in regular exercise at present   Goals Addressed             This Visit's  Progress    Patient Stated       Lose weight        Depression Screen    01/02/2022   11:50 AM 10/16/2020    8:13 AM 09/17/2015    9:36 AM 09/13/2015    9:11 AM 09/05/2015    9:11 AM  PHQ 2/9 Scores  PHQ - 2 Score 0 0 0 0 0    Fall Risk    01/02/2022   11:52 AM 09/18/2020    8:18 AM  Fall Risk   Falls in the past year? 1 0  Number falls in past yr: 1 0  Injury with Fall? 1 0  Risk for fall due to :  Impaired vision   Follow up Falls prevention discussed Falls evaluation completed    FALL RISK PREVENTION PERTAINING TO THE HOME:  Any stairs in or around the home? Yes  If so, are there any without handrails? No  Home free of loose throw rugs in walkways, pet beds, electrical cords, etc? Yes  Adequate lighting in your home to reduce risk of falls? Yes   ASSISTIVE DEVICES UTILIZED TO PREVENT FALLS:  Life alert? No  Use of a cane, walker or w/c? No  Grab bars in the bathroom? Yes  Shower chair or bench in shower? Yes  Elevated toilet seat or a handicapped toilet? No   TIMED UP AND GO:  Was the test performed? No .   Cognitive Function:        01/02/2022   11:54 AM  6CIT Screen  What Year? 0 points  What month? 0 points  What time? 0 points  Count back from 20 0 points  Months in reverse 0 points  Repeat phrase 0 points  Total Score 0 points    Immunizations Immunization History  Administered Date(s) Administered   Influenza,inj,Quad PF,6+ Mos 09/14/2019   PFIZER(Purple Top)SARS-COV-2 Vaccination 10/16/2019, 11/07/2019, 08/09/2020   Tdap 01/13/2019    TDAP status: Up to date  Flu Vaccine status: Due, Education has been provided regarding the importance of this vaccine. Advised may receive this vaccine at local pharmacy or Health Dept. Aware to provide a copy of the vaccination record if obtained from local pharmacy or Health Dept. Verbalized acceptance and understanding.  Pneumococcal vaccine status: Due, Education has been provided regarding the importance of  this vaccine. Advised may receive this vaccine at local pharmacy or Health Dept. Aware to provide a copy of the vaccination record if obtained from local pharmacy or Health Dept. Verbalized acceptance and understanding.  Covid-19 vaccine status: Completed vaccines  Qualifies for Shingles Vaccine? Yes   Zostavax completed No   Shingrix Completed?: No.    Education has been provided regarding the importance of this vaccine. Patient has been advised to call insurance company to determine out of pocket expense if they have not yet received this vaccine. Advised may also receive vaccine at local pharmacy or Health Dept. Verbalized acceptance and understanding.  Screening Tests Health Maintenance  Topic Date Due   Zoster Vaccines- Shingrix (1 of 2) Never done   COLONOSCOPY (Pts 45-16yr Insurance coverage will need to be confirmed)  Never done   COVID-19 Vaccine (4 - Booster for Pfizer series) 10/04/2020   Pneumonia Vaccine 65 Years old (1 - PCV) 12/08/2021   INFLUENZA VACCINE  03/04/2022   TETANUS/TDAP  01/12/2029   Hepatitis C Screening  Completed   HIV Screening  Completed   HPV VACCINES  Aged Out    Health Maintenance  Health Maintenance Due  Topic Date Due   Zoster Vaccines- Shingrix (1 of 2) Never done   COLONOSCOPY (Pts 45-471yrInsurance coverage will need to be confirmed)  Never done   COVID-19 Vaccine (4 - Booster for Pfizer series) 10/04/2020   Pneumonia Vaccine 6540Years old (1 - PCV) 12/08/2021    Colorectal cancer screening: Referral to GI placed 01/02/22. Pt aware the office will call re: appt.   Additional Screening:  Hepatitis C Screening: Completed 10/16/20  Vision Screening: Recommended annual ophthalmology exams for early detection of glaucoma and other disorders of the eye. Is the patient up to date with their annual eye exam?  Yes  Who is the provider or what is  the name of the office in which the patient attends annual eye exams? [facility]  If pt is not  established with a provider, would they like to be referred to a provider to establish care? No .   Dental Screening: Recommended annual dental exams for proper oral hygiene  Community Resource Referral / Chronic Care Management: CRR required this visit?  No   CCM required this visit?  No      Plan:     I have personally reviewed and noted the following in the patient's chart:   Medical and social history Use of alcohol, tobacco or illicit drugs  Current medications and supplements including opioid prescriptions. Patient is not currently taking opioid prescriptions. Functional ability and status Nutritional status Physical activity Advanced directives List of other physicians Hospitalizations, surgeries, and ER visits in previous 12 months Vitals Screenings to include cognitive, depression, and falls Referrals and appointments  In addition, I have reviewed and discussed with patient certain preventive protocols, quality metrics, and best practice recommendations. A written personalized care plan for preventive services as well as general preventive health recommendations were provided to patient.     Willette Brace, LPN   03/10/5642   Nurse Notes: None

## 2022-01-06 DIAGNOSIS — M25661 Stiffness of right knee, not elsewhere classified: Secondary | ICD-10-CM | POA: Diagnosis not present

## 2022-01-06 DIAGNOSIS — M25561 Pain in right knee: Secondary | ICD-10-CM | POA: Diagnosis not present

## 2022-01-09 DIAGNOSIS — M25561 Pain in right knee: Secondary | ICD-10-CM | POA: Diagnosis not present

## 2022-01-13 DIAGNOSIS — M25661 Stiffness of right knee, not elsewhere classified: Secondary | ICD-10-CM | POA: Diagnosis not present

## 2022-01-14 DIAGNOSIS — Z5189 Encounter for other specified aftercare: Secondary | ICD-10-CM | POA: Diagnosis not present

## 2022-01-15 ENCOUNTER — Other Ambulatory Visit: Payer: Self-pay | Admitting: Physician Assistant

## 2022-01-16 DIAGNOSIS — M25561 Pain in right knee: Secondary | ICD-10-CM | POA: Diagnosis not present

## 2022-01-20 DIAGNOSIS — M25561 Pain in right knee: Secondary | ICD-10-CM | POA: Diagnosis not present

## 2022-01-20 DIAGNOSIS — M25661 Stiffness of right knee, not elsewhere classified: Secondary | ICD-10-CM | POA: Diagnosis not present

## 2022-01-23 DIAGNOSIS — M25561 Pain in right knee: Secondary | ICD-10-CM | POA: Diagnosis not present

## 2022-01-28 ENCOUNTER — Telehealth: Payer: Self-pay | Admitting: Physician Assistant

## 2022-01-28 ENCOUNTER — Encounter: Payer: Self-pay | Admitting: Gastroenterology

## 2022-01-28 ENCOUNTER — Telehealth: Payer: Self-pay

## 2022-01-28 NOTE — Telephone Encounter (Signed)
Pt's wife is asking to speak with Inetta Fermo regarding pt.

## 2022-02-19 ENCOUNTER — Ambulatory Visit (AMBULATORY_SURGERY_CENTER): Payer: Medicare HMO | Admitting: *Deleted

## 2022-02-19 VITALS — Ht 70.0 in | Wt 240.4 lb

## 2022-02-19 DIAGNOSIS — Z1211 Encounter for screening for malignant neoplasm of colon: Secondary | ICD-10-CM

## 2022-02-19 NOTE — Progress Notes (Signed)
No egg or soy allergy known to patient  No issues known to pt with past sedation with any surgeries or procedures Patient denies ever being told they had issues or difficulty with intubation  No FH of Malignant Hyperthermia Pt is not on diet pills Pt is not on  home 02  Pt is not on blood thinners  Pt denies issues with constipation  No A fib or A flutter Have any cardiac testing pending--no Pt instructed to use [email or web address] or GoodRx for a price reduction on prep   

## 2022-03-07 ENCOUNTER — Encounter: Payer: Self-pay | Admitting: Gastroenterology

## 2022-03-12 ENCOUNTER — Ambulatory Visit (AMBULATORY_SURGERY_CENTER): Payer: Medicare HMO | Admitting: Gastroenterology

## 2022-03-12 ENCOUNTER — Encounter: Payer: Self-pay | Admitting: Gastroenterology

## 2022-03-12 VITALS — BP 129/75 | HR 79 | Temp 98.0°F | Resp 14 | Ht 70.0 in | Wt 240.0 lb

## 2022-03-12 DIAGNOSIS — D122 Benign neoplasm of ascending colon: Secondary | ICD-10-CM

## 2022-03-12 DIAGNOSIS — D123 Benign neoplasm of transverse colon: Secondary | ICD-10-CM

## 2022-03-12 DIAGNOSIS — I10 Essential (primary) hypertension: Secondary | ICD-10-CM | POA: Diagnosis not present

## 2022-03-12 DIAGNOSIS — D125 Benign neoplasm of sigmoid colon: Secondary | ICD-10-CM

## 2022-03-12 DIAGNOSIS — Z1211 Encounter for screening for malignant neoplasm of colon: Secondary | ICD-10-CM

## 2022-03-12 MED ORDER — SODIUM CHLORIDE 0.9 % IV SOLN: 500.0000 mL | Freq: Once | INTRAVENOUS | Status: DC

## 2022-03-12 NOTE — Patient Instructions (Signed)
Handout on polyps, high fiber diet, and hemorrhoids given to patient. Await pathology results. Resume previous diet and continue present medications. Recommended to take FiberCon 1-2 tablets daily. Repeat colonoscopy in 3 years for surveillance.  YOU HAD AN ENDOSCOPIC PROCEDURE TODAY AT [facility]:   Refer to the procedure report that was given to you for any specific questions about what was found during the examination.  If the procedure report does not answer your questions, please call your gastroenterologist to clarify.  If you requested that your care partner not be given the details of your procedure findings, then the procedure report has been included in a sealed envelope for you to review at your convenience later.  YOU SHOULD EXPECT: Some feelings of bloating in the abdomen. Passage of more gas than usual.  Walking can help get rid of the air that was put into your GI tract during the procedure and reduce the bloating. If you had a lower endoscopy (such as a colonoscopy or flexible sigmoidoscopy) you may notice spotting of blood in your stool or on the toilet paper. If you underwent a bowel prep for your procedure, you may not have a normal bowel movement for a few days.  Please Note:  You might notice some irritation and congestion in your nose or some drainage.  This is from the oxygen used during your procedure.  There is no need for concern and it should clear up in a day or so.  SYMPTOMS TO REPORT IMMEDIATELY:  Following lower endoscopy (colonoscopy or flexible sigmoidoscopy):  Excessive amounts of blood in the stool  Significant tenderness or worsening of abdominal pains  Swelling of the abdomen that is new, acute  Fever of 100F or higher  For urgent or emergent issues, a gastroenterologist can be reached at any hour by calling [phone]. Do not use MyChart messaging for urgent concerns.    DIET:  We do recommend a small meal at first, but then you may  proceed to your regular diet.  Drink plenty of fluids but you should avoid alcoholic beverages for 24 hours.  ACTIVITY:  You should plan to take it easy for the rest of today and you should NOT DRIVE or use heavy machinery until tomorrow (because of the sedation medicines used during the test).    FOLLOW UP: Our staff will call the number listed on your records the next business day following your procedure.  We will call around 7:15- 8:00 am to check on you and address any questions or concerns that you may have regarding the information given to you following your procedure. If we do not reach you, we will leave a message.  If you develop any symptoms (ie: fever, flu-like symptoms, shortness of breath, cough etc.) before then, please call [phone].  If you test positive for Covid 19 in the 2 weeks post procedure, please call and report this information to [place].    If any biopsies were taken you will be contacted by phone or by letter within the next 1-3 weeks.  Please call us at [phone] if you have not heard about the biopsies in 3 weeks.    SIGNATURES/CONFIDENTIALITY: You and/or your care partner have signed paperwork which will be entered into your electronic medical record.  These signatures attest to the fact that that the information above on your After Visit Summary has been reviewed and is understood.  Full responsibility of the confidentiality of this discharge information lies with you and/or  your care-partner.

## 2022-03-12 NOTE — Progress Notes (Signed)
Called to room to assist during endoscopic procedure.  Patient ID and intended procedure confirmed with present staff. Received instructions for my participation in the procedure from the performing physician.  

## 2022-03-12 NOTE — Progress Notes (Signed)
GASTROENTEROLOGY PROCEDURE H&P NOTE   Primary Care Physician: Allwardt, Randa Evens, PA-C  HPI: Kenneth Snyder is a 66 y.o. male who presents for colonoscopy for screening.  Past Medical History:  Diagnosis Date   Allergy    SEASONAL   Does use hearing aid    bilateral   Hypertension    Past Surgical History:  Procedure Laterality Date   DG KNEE RIGHT COMPLETE ([place] [facility]) Right 2018   KNEE SURGERY Left    "cleaned out"   TENDON REPAIR Left arm   Current Outpatient Medications  Medication Sig Dispense Refill   aspirin 325 MG tablet aspirin 325 mg tablet  Take 1 tablet by mouth twice a day for three weeks following surgery. Then take an 81 mg Aspirin once a day for three weeks. Then discontinue.     levocetirizine (XYZAL) 5 MG tablet Take 5 mg by mouth every evening.     losartan (COZAAR) 25 MG tablet TAKE 1 TABLET (25 MG TOTAL) BY MOUTH DAILY. 90 tablet 1   No current facility-administered medications for this visit.    Current Outpatient Medications:    aspirin 325 MG tablet, aspirin 325 mg tablet  Take 1 tablet by mouth twice a day for three weeks following surgery. Then take an 81 mg Aspirin once a day for three weeks. Then discontinue., Disp: , Rfl:    levocetirizine (XYZAL) 5 MG tablet, Take 5 mg by mouth every evening., Disp: , Rfl:    losartan (COZAAR) 25 MG tablet, TAKE 1 TABLET (25 MG TOTAL) BY MOUTH DAILY., Disp: 90 tablet, Rfl: 1 No Known Allergies Family History  Problem Relation Age of Onset   Cancer Mother    ALS Mother    Crohn's disease Father    Cancer Father    ALS Sister    ALS Brother    Colon cancer Neg Hx    Colon polyps Neg Hx    Esophageal cancer Neg Hx    Rectal cancer Neg Hx    Stomach cancer Neg Hx    Social History   Socioeconomic History   Marital status: Married    Spouse name: Not on file   Number of children: Not on file   Years of education: Not on file   Highest education level: Not on file  Occupational History   Not on  file  Tobacco Use   Smoking status: Never    Passive exposure: Past (wife,parents)   Smokeless tobacco: Never  Vaping Use   Vaping Use: Never used  Substance and Sexual Activity   Alcohol use: Yes    Alcohol/week: 8.0 standard drinks of alcohol    Types: 8 Cans of beer per week    Comment: friday and saturday   Drug use: Yes    Types: Marijuana    Comment: lasted used 2 days ago updated 02/19/22   Sexual activity: Yes    Birth control/protection: None  Other Topics Concern   Not on file  Social History Narrative   Not on file   Social Determinants of Health   Financial Resource Strain: Low Risk  (01/02/2022)   Overall Financial Resource Strain (CARDIA)    Difficulty of Paying Living Expenses: Not hard at all  Food Insecurity: No Food Insecurity (01/02/2022)   Hunger Vital Sign    Worried About Running Out of Food in the Last Year: Never true    Ran Out of Food in the Last Year: Never true  Transportation Needs: No Transportation  Needs (01/02/2022)   PRAPARE - Hydrologist (Medical): No    Lack of Transportation (Non-Medical): No  Physical Activity: Inactive (01/02/2022)   Exercise Vital Sign    Days of Exercise per Week: 0 days    Minutes of Exercise per Session: 0 min  Stress: No Stress Concern Present (01/02/2022)   [place]    Feeling of Stress : Not at all  Social Connections: Socially Isolated (01/02/2022)   Social Connection and Isolation Panel [NHANES]    Frequency of Communication with Friends and Family: Never    Frequency of Social Gatherings with Friends and Family: Never    Attends Religious Services: Never    Marine scientist or Organizations: No    Attends Archivist Meetings: Never    Marital Status: Married  Human resources officer Violence: Not At Risk (01/02/2022)   Humiliation, Afraid, Rape, and Kick questionnaire    Fear of Current or Ex-Partner: No     Emotionally Abused: No    Physically Abused: No    Sexually Abused: No    Physical Exam: There were no vitals filed for this visit. There is no height or weight on file to calculate BMI. GEN: NAD EYE: Sclerae anicteric ENT: MMM CV: Non-tachycardic GI: Soft, NT/ND NEURO:  Alert & Oriented x 3  Lab Results: No results for input(s): "WBC", "HGB", "HCT", "PLT" in the last 72 hours. BMET No results for input(s): "NA", "K", "CL", "CO2", "GLUCOSE", "BUN", "CREATININE", "CALCIUM" in the last 72 hours. LFT No results for input(s): "PROT", "ALBUMIN", "AST", "ALT", "ALKPHOS", "BILITOT", "BILIDIR", "IBILI" in the last 72 hours. PT/INR No results for input(s): "LABPROT", "INR" in the last 72 hours.   Impression / Plan: This is a 66 y.o.male who presents for colonoscopy for screening.  The risks and benefits of endoscopic evaluation/treatment were discussed with the patient and/or family; these include but are not limited to the risk of perforation, infection, bleeding, missed lesions, lack of diagnosis, severe illness requiring hospitalization, as well as anesthesia and sedation related illnesses.  The patient's history has been reviewed, patient examined, no change in status, and deemed stable for procedure.  The patient and/or family is agreeable to proceed.    Justice Britain, MD [facility] Office # [phone]

## 2022-03-12 NOTE — Op Note (Signed)
[place] Patient Name: Kenneth Snyder Procedure Date: 03/12/2022 9:28 AM MRN: [id number] Endoscopist: Justice Britain , MD Age: 66 Referring MD:  Date of Birth: 03/20/1956 Gender: Male Account #: [id number] Procedure:                Colonoscopy Indications:              Screening for colorectal malignant neoplasm Medicines:                Monitored Anesthesia Care Procedure:                Pre-Anesthesia Assessment:                           - Prior to the procedure, a History and Physical                            was performed, and patient medications and                            allergies were reviewed. The patient's tolerance of                            previous anesthesia was also reviewed. The risks                            and benefits of the procedure and the sedation                            options and risks were discussed with the patient.                            All questions were answered, and informed consent                            was obtained. Prior Anticoagulants: The patient has                            taken no previous anticoagulant or antiplatelet                            agents except for aspirin. ASA Grade Assessment: II                            - A patient with mild systemic disease. After                            reviewing the risks and benefits, the patient was                            deemed in satisfactory condition to undergo the                            procedure.  After obtaining informed consent, the colonoscope                            was passed under direct vision. Throughout the                            procedure, the patient's blood pressure, pulse, and                            oxygen saturations were monitored continuously. The                            CF [id number] #[id number] was introduced through the anus                            and advanced to the 5 cm into the ileum. The                             colonoscopy was performed without difficulty. The                            patient tolerated the procedure. The quality of the                            bowel preparation was good. The terminal ileum,                            ileocecal valve, appendiceal orifice, and rectum                            were photographed. Scope In: 9:38:26 AM Scope Out: 9:55:12 AM Scope Withdrawal Time: 0 hours 15 minutes 10 seconds  Total Procedure Duration: 0 hours 16 minutes 46 seconds  Findings:                 The digital rectal exam findings include                            hemorrhoids. Pertinent negatives include no                            palpable rectal lesions.                           The terminal ileum and ileocecal valve appeared                            normal.                           Seven sessile polyps were found in the sigmoid                            colon (2), transverse colon (3) and ascending colon                            (  2). The polyps were 4 to 15 mm in size. These                            polyps were removed with a cold snare. Resection                            and retrieval were complete.                           Normal mucosa was found in the entire colon                            otherwise.                           Non-bleeding non-thrombosed external and internal                            hemorrhoids were found during retroflexion, during                            perianal exam and during digital exam. The                            hemorrhoids were Grade II (internal hemorrhoids                            that prolapse but reduce spontaneously). Complications:            No immediate complications. Estimated Blood Loss:     Estimated blood loss was minimal. Impression:               - Hemorrhoids found on digital rectal exam.                           - The examined portion of the ileum was normal.                            - Seven 4 to 15 mm polyps in the sigmoid colon, in                            the transverse colon and in the ascending colon,                            removed with a cold snare. Resected and retrieved.                           - Normal mucosa in the entire examined colon                            otherwise.                           - Non-bleeding non-thrombosed external and internal  hemorrhoids. Recommendation:           - The patient will be observed post-procedure,                            until all discharge criteria are met.                           - Discharge patient to home.                           - Patient has a contact number available for                            emergencies. The signs and symptoms of potential                            delayed complications were discussed with the                            patient. Return to normal activities tomorrow.                            Written discharge instructions were provided to the                            patient.                           - High fiber diet.                           - Use FiberCon 1-2 tablets PO daily.                           - Continue present medications.                           - Await pathology results.                           - Repeat colonoscopy in 3 years for surveillance.                           - The findings and recommendations were discussed                            with the patient.                           - The findings and recommendations were discussed                            with the patient's family. Justice Britain, MD 03/12/2022 10:00:04 AM

## 2022-03-12 NOTE — Progress Notes (Signed)
Report to PACU, RN, vss, BBS= Clear.  

## 2022-03-12 NOTE — Progress Notes (Signed)
Pt's states no medical or surgical changes since previsit or office visit. 

## 2022-03-13 ENCOUNTER — Telehealth: Payer: Self-pay | Admitting: *Deleted

## 2022-03-13 NOTE — Telephone Encounter (Signed)
Attempted to call patient for their post-procedure follow-up call. No answer. Left voicemail.   

## 2022-03-14 ENCOUNTER — Encounter: Payer: Self-pay | Admitting: Gastroenterology

## 2022-04-28 ENCOUNTER — Encounter: Payer: Self-pay | Admitting: *Deleted

## 2022-07-17 ENCOUNTER — Encounter: Payer: Self-pay | Admitting: *Deleted

## 2022-08-26 ENCOUNTER — Other Ambulatory Visit: Payer: Self-pay | Admitting: Physician Assistant

## 2022-12-21 ENCOUNTER — Other Ambulatory Visit: Payer: Self-pay | Admitting: Physician Assistant

## 2023-02-24 ENCOUNTER — Telehealth: Payer: Self-pay | Admitting: *Deleted

## 2023-02-27 ENCOUNTER — Encounter: Payer: Self-pay | Admitting: *Deleted

## 2023-02-27 NOTE — Telephone Encounter (Signed)
I attempted to contact patient by telephone but was unsuccessful. According to the patient's chart they are due for physical with [facility]. I have left a HIPAA compliant message advising the patient to contact [facility] at [phone]. I will continue to follow up with the patient to make sure this appointment is scheduled.

## 2023-03-31 DIAGNOSIS — Z13220 Encounter for screening for lipoid disorders: Secondary | ICD-10-CM | POA: Diagnosis not present

## 2023-03-31 DIAGNOSIS — Z0000 Encounter for general adult medical examination without abnormal findings: Secondary | ICD-10-CM | POA: Diagnosis not present

## 2023-03-31 DIAGNOSIS — Z125 Encounter for screening for malignant neoplasm of prostate: Secondary | ICD-10-CM | POA: Diagnosis not present

## 2023-03-31 DIAGNOSIS — I10 Essential (primary) hypertension: Secondary | ICD-10-CM | POA: Diagnosis not present

## 2023-03-31 DIAGNOSIS — E6609 Other obesity due to excess calories: Secondary | ICD-10-CM | POA: Diagnosis not present

## 2023-03-31 DIAGNOSIS — Z6834 Body mass index (BMI) 34.0-34.9, adult: Secondary | ICD-10-CM | POA: Diagnosis not present

## 2023-03-31 DIAGNOSIS — L989 Disorder of the skin and subcutaneous tissue, unspecified: Secondary | ICD-10-CM | POA: Diagnosis not present

## 2023-04-02 DIAGNOSIS — Z125 Encounter for screening for malignant neoplasm of prostate: Secondary | ICD-10-CM | POA: Diagnosis not present

## 2023-04-02 DIAGNOSIS — E6609 Other obesity due to excess calories: Secondary | ICD-10-CM | POA: Diagnosis not present

## 2023-04-02 DIAGNOSIS — Z13220 Encounter for screening for lipoid disorders: Secondary | ICD-10-CM | POA: Diagnosis not present

## 2023-04-02 DIAGNOSIS — I10 Essential (primary) hypertension: Secondary | ICD-10-CM | POA: Diagnosis not present

## 2023-04-02 DIAGNOSIS — L989 Disorder of the skin and subcutaneous tissue, unspecified: Secondary | ICD-10-CM | POA: Diagnosis not present

## 2023-04-02 DIAGNOSIS — Z6834 Body mass index (BMI) 34.0-34.9, adult: Secondary | ICD-10-CM | POA: Diagnosis not present

## 2023-04-20 DIAGNOSIS — C44712 Basal cell carcinoma of skin of right lower limb, including hip: Secondary | ICD-10-CM | POA: Diagnosis not present

## 2023-04-20 DIAGNOSIS — D485 Neoplasm of uncertain behavior of skin: Secondary | ICD-10-CM | POA: Diagnosis not present

## 2023-04-20 DIAGNOSIS — L814 Other melanin hyperpigmentation: Secondary | ICD-10-CM | POA: Diagnosis not present

## 2023-04-20 DIAGNOSIS — C44519 Basal cell carcinoma of skin of other part of trunk: Secondary | ICD-10-CM | POA: Diagnosis not present

## 2023-04-20 DIAGNOSIS — C44709 Unspecified malignant neoplasm of skin of left lower limb, including hip: Secondary | ICD-10-CM | POA: Diagnosis not present

## 2023-04-20 DIAGNOSIS — L570 Actinic keratosis: Secondary | ICD-10-CM | POA: Diagnosis not present

## 2023-05-12 DIAGNOSIS — C44519 Basal cell carcinoma of skin of other part of trunk: Secondary | ICD-10-CM | POA: Diagnosis not present

## 2023-05-12 DIAGNOSIS — C44712 Basal cell carcinoma of skin of right lower limb, including hip: Secondary | ICD-10-CM | POA: Diagnosis not present

## 2023-05-12 DIAGNOSIS — C44719 Basal cell carcinoma of skin of left lower limb, including hip: Secondary | ICD-10-CM | POA: Diagnosis not present

## 2023-05-12 DIAGNOSIS — D0471 Carcinoma in situ of skin of right lower limb, including hip: Secondary | ICD-10-CM | POA: Diagnosis not present

## 2023-05-12 DIAGNOSIS — D485 Neoplasm of uncertain behavior of skin: Secondary | ICD-10-CM | POA: Diagnosis not present

## 2023-06-23 DIAGNOSIS — Z85828 Personal history of other malignant neoplasm of skin: Secondary | ICD-10-CM | POA: Diagnosis not present

## 2023-06-23 DIAGNOSIS — L570 Actinic keratosis: Secondary | ICD-10-CM | POA: Diagnosis not present

## 2023-06-23 DIAGNOSIS — L578 Other skin changes due to chronic exposure to nonionizing radiation: Secondary | ICD-10-CM | POA: Diagnosis not present

## 2023-06-23 DIAGNOSIS — L821 Other seborrheic keratosis: Secondary | ICD-10-CM | POA: Diagnosis not present

## 2023-06-23 DIAGNOSIS — L814 Other melanin hyperpigmentation: Secondary | ICD-10-CM | POA: Diagnosis not present

## 2023-06-23 DIAGNOSIS — D229 Melanocytic nevi, unspecified: Secondary | ICD-10-CM | POA: Diagnosis not present

## 2023-06-25 DIAGNOSIS — H52223 Regular astigmatism, bilateral: Secondary | ICD-10-CM | POA: Diagnosis not present

## 2024-03-28 ENCOUNTER — Encounter: Payer: Self-pay | Admitting: Psychology

## 2024-05-18 ENCOUNTER — Encounter: Attending: Psychology | Admitting: Psychology

## 2024-05-18 ENCOUNTER — Encounter: Payer: Self-pay | Admitting: Psychology

## 2024-05-18 DIAGNOSIS — R4189 Other symptoms and signs involving cognitive functions and awareness: Secondary | ICD-10-CM | POA: Insufficient documentation

## 2024-05-18 NOTE — Progress Notes (Signed)
 NEUROPSYCHOLOGICAL EVALUATION [place]. [facility]  Physical Medicine and Rehabilitation     Patient: Kenneth Snyder  MRN: [id number] DOB: August 26, 1955  Age: 68 y.o. Sex: male  Race/Ethnicity: White or Caucasian  Years of Education: 16 Handedness: Right  Referring Provider: Samie Tylene DEVONNA Dannielle Norleen JAYSON, MD    Provider/Clinical Neuropsychologist: Evalene DOROTHA Riff, PsyD  Date of Service: 05/18/2024 Start Time: 8 AM End Time: 10 AM  Location of Service:  [facility] [place]. [facility] [address] [place]. [address] [place], [place] [address] Phone: [phone]  Billing Code/Service:            96116/96121  Individuals Present: Patient was seen unaccompanied, in-person, by the provider. 1 hour and 15 minutes spent in face-to-face clinical interview and remaining 45 minutes was spent in record review, documentation, and testing protocol construction.    PATIENT CONSENT AND CONFIDENTIALITY The patient's understanding of the reason for referral was intact. Discussed limits of confidentiality including, but not limited to, posting of final evaluation report in the patient's electronic medical record for both the patient and for the referring provider and appropriate medical professionals. Patient was given the opportunity to have their questions answered. The neuropsychological evaluation process was discussed with the patient and they consented to proceed with the evaluation.  Consent for Evaluation and Treatment: Signed: Yes Explanation of Privacy Policies: Signed: Yes Discussion of Confidentiality Limits: Yes  REASON FOR REFERRAL:The patient is a 68 year old man with history of multiple concussions referred by his primary care provider, Tylene Samie, PA-C, for neuropsychological evaluation due to memory difficulties. Per records, the patient was seen for memory concerns and difficulties hearing and processing on  03/01/24 by the referring provider. Visit notes indicated difficulty remembering conversations, forgetfulness when driving, and possible difficulties with auditory-verbal processing. The patient wears hearing aids. Family is reportedly concerned as well. The patient has no known family history of dementia of Alzheimer's. No difficulties with sleep or stroke like symptoms were reported. The patient was referred to neurology and neuropsychology. The patient was seen by neurologist Dr. Dannielle on 04/12/24. Per visit notes, cognitive difficulties involve memory loss and difficulty with word recall that the patient felt began in 2015 when he sustained a concussion.  Records indicate the patient was accompanied by his spouse, who felt symptoms have been progressively worsening since he stopped working approximately 8 years ago due to knee injury.  Records report that the patient had initial recovery following the concussion.  Records note the patient endorsed difficulties recalling directions while driving but no other problems with driving.  Records also indicated that the patient had a history of three concussions.  The first occurred during childhood when a hammer fell from a height of around 25 feet and struck him in the head, the next involved impact while playing football, and the last occurred in the workplace.  Some difficulties with hearing loss were also reported.  Records noted family history of ALS and cancer. Mental status at that visit was unremarkable. Assessment and plan section of that visit note indicated plan for comprehensive workup including MRI and lab work.   The patient presented to the clinical interview for the current evaluation unaccompanied. The patient provider permission for the provider to contact his spouse via phone for collateral interview as needed. The patient was oriented to the purpose of the evaluation and interested in proceeding with the evaluation process and efforts to better  understand the nature of  his cognitive difficulties.  HISTORY OF PRESENTING CONCERNS: Per patient report, Cognitive Symptom Onset & Course: The patient indicated that he suspects cognitive difficulties began with head injury approximately 10 to 11 years ago.  The patient reported that this occurred when at work and involved his head striking a steel beam.  His descriptions indicate suspicion of a brief loss of consciousness. The patient reported that he was seeing double had nausea, and had difficulty with balance. Records dating 08/29/15 were located in his medical chart; CT was negative for acute findings, Patient states that he hit his head on a steel beam today at work.  He hit the back of his head. States that it caused him to pass out and he fell to the the ground.  He reports associated nausea, but no vomiting.  States that he had tingling throughout his entire body, but not in any particular region.  He states that he has had some difficulty remembering things since the accident and has been asking questions repeatedly.  He states that he has had some difficulty balancing.  He denies vision changes, weakness, or numbness. The patient was seen on 09/05/15 by his PCP for follow up, and on 2/9 and 2/13 by [facility] with residual symptoms from the concussion. Symptoms were reportedly improving. Upon interview within the current evaluation the patient indicated that his initial physical symptoms have resolved.  He indicated that he does feel that his cognitive difficulties are worsening over time.  He indicated that coworkers described noticing changes when he had returned to work that he said I'm not the same person I was and he elaborated that he was quicker to anger, and not taking things as seriously. He described his wife (married 42 years) as indicating that he has had changes as well. The patient described some changes in executive functioning that he suspects occurred a couple years after  the injury.   BACKGROUND & PRESENTING CONCERNS:  Current Cognitive Complaints: Per patient report, Memory: The patient described somewhat variable but persistent difficulties with short-term memory.  He described occasional difficulties with remembering conversations and recent events.  He indicated he relies on location to help reduce misplacing things due to memory difficulties.  He indicated that his spouse has helped him keep track of medical appointments for the last 10 years or so.  He denied problems remembering to take his blood pressure medication in the morning.  He reported some difficulties with recalling where he is traveling when driving.   Processing Speed: The patient endorsed slowed processing relative to his historical baseline.  Attention & Concentration: He endorsed increased difficulty with maintaining attention and concentration and with distractibility. Language: He endorsed word finding difficulties.  He denied problems with basic auditory verbal comprehension when controlling for hearing difficulties.  He denied any problems in his ability to read.  He indicated that his writing composition may be slightly worse disease less precise in his punctuation.  He feels mental arithmetic is much harder for him. Visual-Spatial: He describes some possible indications of difficulties with visual-spatial functioning within the context of tying fishing lures. Executive Functioning: Patient described increased irritability/shorter temper but denied problems with acting impulsively.  He feels problem solving may be harder and he describes some indications of perfectionism when trying to complete certain tasks which is a notable change for him.  He indicated that his filter is reduced when talking.   Motor/Sensory Complaints:  Sensory changes: Reduced sense of smell dating to approximately 4 years  ago.  He has had COVID infections but did not associate that with his loss of smell.  This been a  change in his sense of taste appears related to anosmia.  He had cataract surgery about 2 months ago which has been helpful for his vision.  He wears hearing aids and has done so for about 12 years and he denied any problems in this respect. Balance/coordination difficulties: He denied having frequent falls but endorsed reduced sense of balance. Frequent instances of dizziness/vertigo: Infrequent feelings of lightheadedness. Other motor difficulties: No tremor.  No changes in handwriting.  Emotional and Behavioral Functioning:  Depression: He reported only brief episodes of low mood which always followed concussion.  He denied any current difficulties with frequent feelings of sadness or any indications of anhedonia or suicidal ideation. Anxiety: Patient described some mild difficulties with worry and anxiety.  He indicated he sometimes feels tense.  He denies significant difficulties with relaxing. Other: Patient described traumatic events in the past but did not endorse any active PTSD symptomology. He denied any history of panic attacks. He denied any significant psychiatric history and has never received a mental health diagnosis or participated in psychiatric treatment. He denied any experiences of hallucination, paranoia, episodes of manic/hypomanic symptoms, and has no history of inpatient psychiatric admission.   Sleep: Patient indicated that he sleeps about 8 to 9 hours per night and has no difficulties with sleep onset or maintenance.  He denied his wife ever making comments regarding behaviors suggestive of RBD's.  He has no history of sleep apnea. Appetite: No unintentional significant weight loss or gain. Caffeine: None. Alcohol Use: 2 servings Fridays, 5-6 Saturdays, and 7-8 Sundays. Current pattern present for 7 or 8 years. Denied any history of chronic daily consumption.  Tobacco Use: None. Recreational Substance Use: CBD at night for chronic pain starting about a year ago.  Did not  believe there is any THC or delta 8 within it.  Level of Functional Independence: The patient is independent with basic activities of daily living.  Finances: Intact.   Shopping / Meal Preparation: Independent. Described forgetfulness resulting in burning bacon.  Household Maintenance / Chores: Intact.   Tracking Appointments / Future Obligations: Assisted.  Medication Management: Intact.   Driving: Denied concerns outside of instances of forgetting where he was going and possible brief geographic disorientation.     Medical History/Record Review: Per records and patient report, History of traumatic brain injury/concussion: 4 concussions total per patient report.  First occurred at a young age, two in high school, one about 10 years ago. History of stroke: None known History of heart attack: None History of cancer/chemotherapy: None History of seizure activity: None Symptoms of chronic pain: Minimal/not distressing.   Experience of frequent headaches/migraines: 3 headaches a week on average.     Imaging/Lab Results:  Per records, CT Head 08/29/2015 No acute abnormality noted.   Past Medical History:  Diagnosis Date   Allergy    SEASONAL   Does use hearing aid    bilateral   Hypertension   Per [facility] medical records,  Replacement total knee Right 2022   Patient Active Problem List   Diagnosis Date Noted   Essential hypertension 02/20/2021   Family Neurologic/Medical Hx:  Family History  Problem Relation Age of Onset   Cancer Mother    ALS Mother    Crohn's disease Father    Cancer Father    ALS Sister    ALS Brother    Colon  cancer Neg Hx    Colon polyps Neg Hx    Esophageal cancer Neg Hx    Rectal cancer Neg Hx    Stomach cancer Neg Hx    Medications:  levocetirizine (XYZAL) 5 MG tablet Take one tablet (5 mg dose) by mouth every evening.  losartan  potassium (COZAAR ) 25 mg tablet TAKE ONE TABLET BY MOUTH DAILY.  Academic/Vocational History: Performed well  in school, earning mostly As and Bs. No difficulties with attention or concentration in class or other indications of ADHD.  Denied any indications of specific learning disability.  Earned a bachelor's degree in biology.  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 He stopped working at the age of 83 due to physical health problems and weight lifting requirements of the job.   Psychosocial: Marital Status: 40+ years Children/Grandchildren: Two adult daughters and three grandchildren. Living Situation: Lives alone with his spouse. Daily Activities/Hobbies: Golfing and fishing.  Mental Status/Behavioral Observations: The patient was seen on an outpatient basis in the [facility] PM&R office for the clinical interview unaccompanied. Sensorium/Arousal: The patient was alert.  Hearing and vision were adequate for the purpose of the evaluation. Orientation: Full. Appearance: Appropriate dress and hygiene. Behavior: The patient was attentive and cooperative. Speech/Language: Conversational speech was prosodic, fluent, and well-articulated.  No clear word-finding difficulties were noted. Motor: Patient ambulated independently without issue.  No tremor was observed. Social Comportment: Gregarious, possible reduced social inhibition.  Mood: Positive Affect: Congruent Thought Process/Content: No indications of psychosis. Slightly tangential, but easily redirectable.  Ability to Participate in Interview: Readily answered all questions posed during the interview with adequate detail regarding personal history.  Insight: Demonstrated some awareness of cognitive/behavioral changes.  SUMMARY / CLINICAL IMPRESSIONS The patient is a 68 year old man with history of multiple concussions referred by his primary care provider, Tylene Meres, PA-C, for neuropsychological evaluation due to memory difficulties. Per records, the patient was seen for memory concerns and  difficulties hearing and processing on 03/01/24 by the referring provider. Visit notes indicated difficulty remembering conversations, forgetfulness when driving, and possible difficulties with auditory-verbal processing. The patient wears hearing aids. Family is reportedly concerned as well. The patient has no known family history of dementia of Alzheimer's. No difficulties with sleep or stroke like symptoms were reported. The patient was referred to neurology and neuropsychology. The patient was seen by neurologist Dr. Dannielle on 04/12/24. Per visit notes, cognitive difficulties involve memory loss and difficulty with word recall that the patient felt began in 2015 when he sustained a concussion.  Records indicate the patient was accompanied by his spouse, who felt symptoms have been progressively worsening since he stopped working approximately 8 years ago due to knee injury.  Records report that the patient had initial recovery following the concussion.  Records note the patient endorsed difficulties recalling directions while driving but no other problems with driving.  Records also indicated that the patient had a history of three concussions.  The first occurred during childhood when a hammer fell from a height of around 25 feet and struck him in the head, the next involved impact while playing football, and the last occurred in the workplace.  Some difficulties with hearing loss were also reported.  Records noted family history of ALS and cancer. Mental status at that visit was unremarkable. Assessment and plan section of that visit note indicated plan for comprehensive workup including MRI and lab work.   The patient  presented to the clinical interview for the current evaluation unaccompanied. The patient provider permission for the provider to contact his spouse via phone for collateral interview as needed. The patient was oriented to the purpose of the evaluation and interested in proceeding with the  evaluation process and efforts to better understand the nature of his cognitive difficulties.  The patient indicated that he suspects cognitive difficulties began with head injury approximately 10 to 11 years ago.  The patient reported that this occurred when at work and involved his head striking a steel beam.  His descriptions indicate suspicion of a brief loss of consciousness. The patient reported that he was seeing double had nausea, and had difficulty with balance. Records dating 08/29/15 were located in his medical chart; CT was negative for acute findings, Patient states that he hit his head on a steel beam today at work.  He hit the back of his head. States that it caused him to pass out and he fell to the the ground.  He reports associated nausea, but no vomiting.  States that he had tingling throughout his entire body, but not in any particular region.  He states that he has had some difficulty remembering things since the accident and has been asking questions repeatedly.  He states that he has had some difficulty balancing.  He denies vision changes, weakness, or numbness. The patient was seen on 09/05/15 by his PCP for follow up, and on 2/9 and 2/13 by [facility] with residual symptoms from the concussion. Symptoms were reportedly improving. Upon interview within the current evaluation the patient indicated that his initial physical symptoms have resolved.  He indicated that he does feel that his cognitive difficulties are worsening over time.  He indicated that coworkers described noticing changes when he had returned to work that he said I'm not the same person I was and he elaborated that he was quicker to anger, and not taking things as seriously. He described his wife (married 42 years) as indicating that he has had changes as well. The patient described some changes in executive functioning that he suspects occurred a couple years after the injury. Subjective cognitive complaints  involved some modest cognitive changes which appear relatively broad based. Possible behavioral / executive functioning changes are noted. No significant psychiatric difficulties were reported. He appears largely intact in instrumental activities of daily living. Family history of notable for ALS. Neuropsychological evaluation is recommended to evaluate cognitive functioning and support differential diagnosis. The patient indicated plan to follow through with MRI imaging in late October. Testing and feedback visits were adjusted to provide opportunity for the provider to view results of imaging following completion. The patient was amenable to this.    DISPOSITION / PLAN The patient has been set up for a formal neuropsychological assessment to objectively assess her cognitive functioning across domains to establish the patient's cognitive profile. This data, in conjunction with information obtained via clinical interview and medical record review, will help clarify likely etiology and guide treatment recommendations. Once data collection and interpretation have been completed, the findings / diagnosis and recommendations will be reviewed and discussed with the patient during a feedback appointment with the neuropsychologist. Based on the collaborative dialogue with the patient during the feedback, recommendations may be adjusted / tailored as needed. A formal report will be produced and provided to the patient and the referring provider.   Diagnosis: Cognitive Changes History of multiple concussions    Evalene DOROTHA Riff, PsyD Clinical Neuropsychology [phone]  [address], [address] [place], [place] [address] Main: [phone] Fax: [phone]  This report was generated using voice recognition software. While this document has been carefully reviewed, transcription errors may be present. I apologize in advance for any inconvenience. Please contact me if further clarification is needed.

## 2024-06-02 ENCOUNTER — Encounter (HOSPITAL_BASED_OUTPATIENT_CLINIC_OR_DEPARTMENT_OTHER)

## 2024-06-02 DIAGNOSIS — R4189 Other symptoms and signs involving cognitive functions and awareness: Secondary | ICD-10-CM

## 2024-06-02 NOTE — Progress Notes (Signed)
 Mental Status/Behavioral Observations (06/02/2024):  Orientation: The patient was oriented to self, place, and time. Sensory/Arousal: Hearing and vision were adequate for testing with the assistance of hearing aids. The patient was alert. Appearance: Dress and hygiene were appropriate for the setting.   Speech/Language: In conversation, the patient's speech was prosodic, fluent, and well-articulated. The patient displayed no indications of word finding difficulties and no word substitution errors were observed.  Motor: The patient ambulated independently and without issue. No tremors were observed.  Social Comportment: The patient displayed potentially disinhibited social behavior throughout the testing session, often making comments/remarks that were inappropriate for the setting. These comments persisted despite repeated redirection.  Mood/Affect: Mood was largely neutral to positive. Affect was consistent with mood.  Attention/Concentration: The patient appeared to maintain consistent engagement throughout the testing session. No frank attentional lapses were observed.  Thought Process/Content: The patient's thought process seemed slightly tangential but was generally redirectable. There were no indications of psychosis.  Additional Observations: The patient showed no difficulties with understanding task instructions. Some difficulties with frustration tolerance were noted. There were some indications of possible impulsivity as evidenced by prematurely attempting to start practice trials before instructions were finished being given. He was not responsive to redirection in these instances.   Neuropsychology Note Kenneth Snyder completed 130 minutes of neuropsychological testing with technician, Kenneth Snyder, BA, under the supervision of Kenneth Riff, PsyD., Clinical Neuropsychologist. The patient did not appear overtly distressed by the testing session, per behavioral observation or via  self-report to the technician. Rest breaks were offered.   Clinical Decision Making: In considering the patient's current level of functioning, level of presumed impairment, nature of symptoms, emotional and behavioral responses during clinical interview, level of literacy, and observed level of motivation/effort, a battery of tests was selected by Dr. Riff during initial consultation on 05/18/2024. This was communicated to the technician. Communication between the neuropsychologist and technician was ongoing throughout the testing session and changes were made as deemed necessary based on patient performance on testing, technician observations and additional pertinent factors such as those listed above.  Tests Administered: Automatic Data Edition (BNT-2) Brief Visuospatial Memory Test-Revised (BVMT-R) Benton Judgment of Line Orientation (JOLO) [place]  Verbal Learning Test-Third Edition (CVLT-3) Clock Drawing Test Controlled Oral Word Association Test (FAS & Animals) Delis-Kaplan Executive Function System (D-KEFS), select subtests Rey Complex Figure Test (RCFT), select subtests Trail Making Test (TMT; Part A & B) Wechsler Adult Intelligence Scale-Fourth Edition (WAIS-IV), select subtests Wechsler Memory Scale-Fourth Edition (WMS-IV) , select subtests Wechsler Memory Scale-Third Edition (WMS-III), select subtests  Wechsler Test of Adult Reading (WTAR) The Beck Depression Inventory-II (BDI-II) Beck Anxiety Inventory (BAI)   Results: Note: This summary of test scores accompanies the interpretive report and should not be interpreted by unqualified individuals or in isolation without reference to the report. Test scores are relative to age, gender, and educational history as available and appropriate. Measurement properties of test scores: IQ, Index, and Standard Scores (SS): Mean = 100; Standard Deviation = 15; Scaled Scores (ss): Mean = 10; Standard Deviation = 3; Z scores (Z): Mean  = 0; Standard Deviation = 1; T scores (T); Mean = 50; Standard Deviation = 10  Intellectual/Premorbid Functioning Estimate   Norm Score Percentile  Range  Wechsler Test of Adult Reading  SS = 105 63 %ile Average   ATTENTION AND WORKING MEMORY    Norm Score Percentile  Range  WAIS-IV          Digit Span  ss = 7 16 %ile Low Average   DSF  ss = 8 25 %ile Average   Span:    5      DSB  ss = 8 25 %ile Average   Span:    3      DSS  ss = 8 25 %ile Average   Span:    4     WMS-III          Spatial Span  ss = 4 2 %ile Below Average   SSF  ss = 5 5 %ile Below Average   Span:    3      SSB  ss = 6 9 %ile Low Average   Span:    3      PROCESSING SPEED    Norm Score Percentile  Range  WAIS-IV          Coding  ss = 8 25 %ile Average   Symbol Search  ss = 7 16 %ile Low Average   LANGUAGE    Norm Score Percentile  Range  [place] Naming Test (BNT-2)  t = 52 58 %ile Average  COWAT          FAS  t = 37 9 %ile Low Average   Animals  t = 41 18 %ile Low Average   EXECUTIVE FUNCTIONING    Norm Score Percentile  Range  DKEFS - Design Fluency          Condition 1  ss = 9 37 %ile Average   Condition II  ss = 8 25 %ile Average   Condition III  ss = 11 63 %ile Average  Trails A  t = 32 4 %ile Below Average  Trails B  t = 30 2 %ile Below Average   MEMORY    Norm Score Percentile  Range  BVMT-R          Trial 1  t = 44.0 27 %ile Average   Trial 2  t = 37 9 %ile Low Average   Trial 3  t = 32.0 4 %ile Below Average   Total Recall  t = 36.0 8 %ile Below Average   Learning  t = 35.0 7 %ile Below Average   Delayed Recall  t = 31.0 3 %ile Below Average   % Retained    80 >16 %ile Low Average   Hits     3-5 %ile Below Average   False Alarms     6-10 %ile Low to Below Average   Recognition Discriminability     1-2 %ile Below to Exceptionally Low  CVLT-III          Trial 1  ss = 6.0 9 %ile Low Average   Trial 2  ss = 5.0 5 %ile Below Average   Trial 3  ss = 2.0 0.4 %ile Exceptionally Low   Trial 4   ss = 4.0 2 %ile Below Average   Trial 5  ss = 4.0 2 %ile Below Average   Trial B  ss = 9.0 37 %ile Average   Short Delay Free Recall  ss = 7.0 16 %ile Low Average   Short Delay Cued Recall  ss = 6.0 9 %ile Low Average   Long Delay Free Recall  ss = 6.0 9 %ile Low Average   Long Delay Cued Recall  ss = 5.0 5 %ile Below Average   Total Hits  ss = 14.0 91 %ile Above Average   Total False  Positives  ss = 6.0 9 %ile Low Average   Recognition Discriminability  ss = 9.0 37 %ile Average   Total Intrusions  ss = 6.0 9 %ile Low Average   Trials 1-5 Total Correct  SS = 64 1 %ile Exceptionally Low   Total Repetitions  ss = 13.0 84 %ile High Average   List B vs. Trial 1  ss = 10.0 50 %ile Average   SD (FR) vs. Trial 5 Correct  ss = 12.0 75 %ile High Average   LD (FR)vs. SD (FR)  ss = 8.0 25 %ile Average  Wechsler Memory Scale, 4th Edition (WMS-4)         Log. Mem. Immediate Recall  ss = 13 84 %ile High Average   Logical Memory Delayed Recall  ss = 9 37 %ile Average   Logical Recognition    >75th   %ile High Average   VISUAL-SPATIAL    Norm Score Percentile  Range  WAIS-IV          Block Design  ss = 6 9 %ile Low Average  Benton JOLO  ss = 11 63 %ile Average  Rey Complex Figure Copy       <=1 %ile Exceptionally Low  Clock          PERSONALITY AND BEHAVIORAL FUNCTIONING      Score/Interpretation  BDI Raw       7  BDI Severity       Minimal.  BAI Raw       1  BAI Severity       Minimal.    Feedback to Patient: Kenneth Snyder will return on 06/15/2024 for an interactive feedback session with Dr. Hayden at which time his test performances, clinical impressions and treatment recommendations will be reviewed in detail. The patient understands he can contact our office should he require our assistance before this time.  130 minutes spent face-to-face with patient administering standardized tests, 110 minutes spent scoring radiographer, therapeutic). [CPT [id number], [id number]]  Full report to follow.

## 2024-06-13 ENCOUNTER — Encounter: Admitting: Psychology

## 2024-06-15 ENCOUNTER — Encounter: Admitting: Psychology

## 2024-06-21 ENCOUNTER — Encounter: Attending: Psychology | Admitting: Psychology

## 2024-06-21 DIAGNOSIS — R4189 Other symptoms and signs involving cognitive functions and awareness: Secondary | ICD-10-CM | POA: Diagnosis present

## 2024-06-21 DIAGNOSIS — G3184 Mild cognitive impairment, so stated: Secondary | ICD-10-CM

## 2024-07-07 ENCOUNTER — Encounter: Attending: Psychology | Admitting: Psychology

## 2024-07-07 DIAGNOSIS — G3184 Mild cognitive impairment, so stated: Secondary | ICD-10-CM

## 2024-07-07 NOTE — Progress Notes (Signed)
 "  NEUROPSYCHOLOGICAL EVALUATION [place]. [facility]  Physical Medicine and Rehabilitation     Patient: Kenneth Snyder  MRN: [id number] DOB: 1956/02/21  Age: 68 y.o. Sex: male  Race/Ethnicity: White or Caucasian  Years of Education: 16 Handedness: Right  Referring Provider: Allwardt, Mardy CHRISTELLA DEVONNA Dannielle Norleen JAYSON, MD    Provider/Clinical Neuropsychologist: Evalene DOROTHA Riff, PsyD  Date of Service: 06/21/24 Start Time: 10 AM End Time: 11 AM  Location of Service:  [facility] Physical Medicine & Rehabilitation Department [place]. [facility] [address], [place]. [address] [place], [place] [address] Phone: [phone]  Billing Code/Service: [phone]  Individuals Present: Evalene Riff, PsyD 1 hour was spent on interpretation of patient data, interpretation of standardized test results and clinical data, clinical decision making, initial treatment planning/recommendations, and report writing. The report will be amended as needed based on any additional information collected during interactive feedback session.   REASON FOR REFERRAL:The patient is a 68 year old man with history of multiple concussions referred by his primary care provider, Tylene Meres, PA-C, for neuropsychological evaluation due to memory difficulties. Per records, the patient was seen  by the referring provider for memory concerns and difficulties hearing and processing on 03/01/24. Visit notes indicated difficulty remembering conversations, forgetfulness when driving, and possible difficulties with auditory-verbal processing. The patient wears hearing aids. Family is reportedly concerned as well. The patient has no known family history of dementia of Alzheimer's. No difficulties with sleep or stroke like symptoms were reported. The patient was referred to neurology and neuropsychology. The patient was seen by neurologist Dr. Dannielle on 04/12/24. Per visit notes, cognitive difficulties involve memory loss and  difficulty with word recall that the patient felt began in 2015 when he sustained a concussion.  Records indicate the patient was accompanied by his spouse, who felt symptoms have been progressively worsening since he stopped working approximately 8 years ago due to knee injury.  Records report that the patient had initial recovery following the concussion.  Records note the patient endorsed difficulties recalling directions while driving but no other problems with driving.  Records also indicated that the patient had a history of three concussions.  The first occurred during childhood when a hammer fell from a height of around 25 feet and struck him in the head, the next involved impact while playing football, and the last occurred in the workplace.  Some difficulties with hearing loss were also reported.  Records noted family history of ALS and cancer. Mental status at that visit was unremarkable. Assessment and plan section of that visit note indicated plan for comprehensive workup including MRI and lab work.  05/31/2024 MRI of the head without contrast was completed. Per reported results, moderate generalized volume loss without particular lobar predominance and mild chronic small vessel disease was indicated.   The patient presented to the clinical interview for the current evaluation unaccompanied. The patient provider permission for the provider to contact his spouse via phone for collateral interview as needed. The patient was oriented to the purpose of the evaluation and interested in proceeding with the evaluation process and efforts to better understand the nature of his cognitive difficulties. The patient indicated that he suspects cognitive difficulties began with head injury approximately 10 to 11 years ago.  The patient reported that this occurred when at work and involved his head striking a steel beam.  His descriptions indicate suspicion of a brief loss of consciousness. The patient reported that  he was seeing double had nausea,  and had difficulty with balance. Records dating 08/29/15 were located in his medical chart; CT was negative for acute findings, Patient states that he hit his head on a steel beam today at work.  He hit the back of his head. States that it caused him to pass out and he fell to the the ground.  He reports associated nausea, but no vomiting.  States that he had tingling throughout his entire body, but not in any particular region.  He states that he has had some difficulty remembering things since the accident and has been asking questions repeatedly.  He states that he has had some difficulty balancing.  He denies vision changes, weakness, or numbness. The patient was seen on 09/05/15 by his PCP for follow up, and on 2/9 and 2/13 by [facility] with residual symptoms from the concussion. Symptoms were reportedly improving. Upon interview within the current evaluation the patient indicated that his initial physical symptoms have resolved.  He indicated that he does feel that his cognitive difficulties are worsening over time.  He indicated that coworkers described noticing changes when he had returned to work that he said I'm not the same person I was and he elaborated that he was quicker to anger, and not taking things as seriously. He described his wife (married 42 years) as indicating that he has had changes as well. The patient described some changes in executive functioning that he suspects occurred a couple years after the injury.   HISTORY OF PRESENTING CONCERNS: Note: Collateral information was obtained from the patient's spouse, with his permission, during the feedback visit. Information from that interview is included below.   Cognitive Symptom Onset & Course:  Patient Report: As noted above, the patient indicated that he suspects cognitive difficulties began with head injury approximately 10 to 11 years ago within the context of head injury. However, as  noted, he does feel that his cognition has declined over time as well.  Collateral Report: His wife indicated that she has noticed changes starting around 12 to 18 months ago.  Course is reportedly gradual and progressive.  Cognitive Changes:  Memory:  Patient: The patient described somewhat variable but persistent difficulties with short-term memory.  He described occasional difficulties with remembering conversations and recent events.  He indicated he relies on location to help reduce misplacing things due to memory difficulties.  He indicated that his spouse has helped him keep track of medical appointments for the last 10 years or so.  He denied problems remembering to take his blood pressure medication in the morning.  He reported some difficulties with recalling where he is traveling when driving.   Collateral: Denied noticing major changes in short term memory. Some increased forgetfulness (leaving the water on). She indicated that issues with memory may be more related to attention. Denied noticing problems remembering conversations or recent events.  Processing Speed:  Patient: Endorsed slowed processing relative to his baseline.  Collateral: Movement reportedly slowed, but she was uncertain as to whether thinking speed has changed.  Attention & Concentration: Patient: He endorsed increased difficulty with maintaining attention and concentration and with distractibility. Collateral: Observations of declines in attention and concentration. Distractibility is increased.    Language:  Patient: He endorsed word finding difficulties.  He denied problems with basic auditory verbal comprehension when controlling for hearing difficulties.  He denied any problems in his ability to read.  He indicated that his writing composition may be slightly worse disease less precise in his punctuation.  He feels mental arithmetic is much harder for him. Collateral: Observed minor word finding difficulties. More  significant changes involve auditory-verbal comprehension. At times, it seems related to not being present but not always. Difficulties with communication are significant and persistent.  Visual-Spatial:  Patient: He describes some possible indications of difficulties with visual-spatial functioning within the context of tying fishing lures. Collateral: No clear observations of difficulties.    Executive Functioning:  Patient: Patient described increased irritability/shorter temper but denied problems with acting impulsively.  He feels problem solving may be harder and he describes some indications of perfectionism when trying to complete certain tasks which is a notable change for him.  He indicated that his filter is reduced when talking.  Collateral: Endorsed noticing significant behavioral changes. Increased restlessness, fidgety, and pacing. Activity levels have decline significantly over the last 2-3 years. Significant changes in social comportment. She described striking indications of perseverative behavior and inhibition difficulties. Ability to judge appropriate behavior and/or respond appropriately has markedly declined. Indications of reduced empathy. Possible signs of increased cravings for sugary foods, although difficulty to discern given high levels at baseline.   Motor/Sensory Complaints:  Sensory changes:Reduced sense of smell dating to approximately 4 years ago.  He has had COVID infections but did not associate that with his loss of smell.  This been a change in his sense of taste appears related to anosmia.  He had cataract surgery about 2 months ago which has been helpful for his vision.  He wears hearing aids and has done so for about 12 years and he denied any problems in this respect. Balance/coordination difficulties: He denied having frequent falls but endorsed reduced sense of balance. His wife reported no observations of changes in balance or coordination.  Frequent  instances of dizziness/vertigo: Infrequent feelings of lightheadedness. Other motor difficulties: No tremor.  No changes in handwriting. His wife described general slowing that seemed related to motor speed rather than cognitive.   Emotional and Behavioral Functioning:  Patient Report: Depression: He reported only brief episodes of low mood which always followed concussion.  He denied any current difficulties with frequent feelings of sadness or any indications of anhedonia or suicidal ideation. Anxiety: Patient described some mild difficulties with worry and anxiety.  He indicated he sometimes feels tense.  He denies significant difficulties with relaxing. Other: Patient described traumatic events in the past but did not endorse any active PTSD symptomology. He denied any history of panic attacks. He denied any significant psychiatric history and has never received a mental health diagnosis or participated in psychiatric treatment. He denied any experiences of hallucination, paranoia, episodes of manic/hypomanic symptoms, and has no history of inpatient psychiatric admission.  Sleep: Patient indicated that he sleeps about 8 to 9 hours per night and has no difficulties with sleep onset or maintenance.  He denied his wife ever making comments regarding behaviors suggestive of RBD's.  He has no history of sleep apnea. Appetite: No unintentional significant weight loss or gain. Caffeine: None. Alcohol Use: Significant; 2 servings Fridays, 5-6 on Saturdays, and 7-8 on Sundays, regularly / weekly. Current pattern present for 7 or 8 years.   Collateral Report:  No observed indications of depression or anxiety.  No observed indications of hallucination, irritability, paranoia/delusions.  Basic safety awareness intact.  No abnormal behavior involving reward seeking.   Tobacco Use: None. Recreational Substance Use: CBD at night for chronic pain starting about a year ago.  Did not believe there is any THC  or delta  8 within it.  Academic/Vocational History: Performed well in school, earning mostly As and Bs. No difficulties with attention or concentration in class or other indications of ADHD.  Denied any indications of specific learning disability.  Earned a bachelor's degree in biology.  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 He stopped working at the age of 56 due to physical health problems and weight lifting requirements of the job.   Psychosocial: Marital Status: 40+ years Children/Grandchildren: Two adult daughters and three grandchildren. Living Situation: Lives alone with his spouse. Daily Activities/Hobbies: Golfing and fishing.  Level of Functional Independence: The patient is independent with basic activities of daily living.  Finances: (Historically managed by wife)  Shopping / Meal Preparation: Independent. Described forgetfulness resulting in burning bacon.  Household Maintenance / Chores: Intact.   Tracking Appointments / Future Obligations: Intacted.  Medication Management: Intact. No toruble remembering or doubling up.  Driving: Denied concerns outside of instances of forgetting where he was going and possible brief geographic disorientation. I get lost quite a bit.    Collateral Report: Largely consistent with patient report.   Medical History/Record Review: Per records and patient report, History of traumatic brain injury/concussion: 4 concussions total per patient report.  First occurred at a young age, two in high school, one about 10 years ago. History of stroke: None known. History of heart attack: None History of cancer/chemotherapy: None History of seizure activity: None Symptoms of chronic pain: Minimal/not distressing.   Experience of frequent headaches/migraines: 3 headaches a week on average.     Imaging/Lab Results:  Per records, CT Head 08/29/2015 No acute abnormality noted.   Past Medical  History:  Diagnosis Date   Allergy    SEASONAL   Does use hearing aid    bilateral   Hypertension   Per [facility] medical records,  Replacement total knee Right 2022   Patient Active Problem List   Diagnosis Date Noted   Essential hypertension 02/20/2021   Family Neurologic/Medical Hx:  Family History  Problem Relation Age of Onset   Cancer Mother    ALS Mother    Crohn's disease Father    Cancer Father    ALS Sister    ALS Brother    Colon cancer Neg Hx    Colon polyps Neg Hx    Esophageal cancer Neg Hx    Rectal cancer Neg Hx    Stomach cancer Neg Hx    Medications:  levocetirizine (XYZAL) 5 MG tablet Take one tablet (5 mg dose) by mouth every evening.  losartan  potassium (COZAAR ) 25 mg tablet TAKE ONE TABLET BY MOUTH DAILY.    NEUROPSYCHODIAGNOSTIC FINDINGS:  Mental Status/Behavioral Observations (06/02/2024):  Orientation: The patient was oriented to self, place, and time. Sensory/Arousal: Hearing was adequate for testing with the assistance of hearing aids. THe The patient was alert. Appearance: Dress and hygiene were appropriate for the setting.   Speech/Language: In conversation, the patient's speech was prosodic, fluent, and well-articulated. The patient displayed no indications of word finding difficulties and no word substitution errors were observed.  Motor: The patient ambulated independently and without issue. No tremors were observed.  Social Comportment: Social behavioral interaction in conversation was not appropriate for the setting. Redirection and prompting had limited impact.   Mood/Affect: Mood was largely neutral to positive. Affect was consistent with mood.  Attention/Concentration: The patient appeared to maintain consistent engagement throughout the testing session. No frank attentional lapses  were observed.  Thought Process/Content:  No indications of psychosis.  Additional Observations: The patient showed no difficulties with understanding  task instructions. Some difficulties with frustration tolerance were noted. There were some indications of possible impulsivity as evidenced by prematurely attempting to start practice trials before instructions were finished being given. He was not responsive to redirection in these instances.  Tests Administered: Automatic Data Edition (BNT-2) Brief Visuospatial Memory Test-Revised (BVMT-R) Benton Judgment of Line Orientation (JOLO) [place]  Verbal Learning Test-Third Edition (CVLT-3) Clock Drawing Test Controlled Oral Word Association Test (FAS & Animals) Delis-Kaplan Executive Function System (D-KEFS), select subtests Rey Complex Figure Test (RCFT), select subtests Trail Making Test (TMT; Part A & B) Wechsler Adult Intelligence Scale-Fourth Edition (WAIS-IV), select subtests Wechsler Memory Scale-Fourth Edition (WMS-IV) , select subtests Wechsler Memory Scale-Third Edition (WMS-III), select subtests  Wechsler Test of Adult Reading (WTAR) The Beck Depression Inventory-II (BDI-II) Beck Anxiety Inventory (BAI)   Neuropsychological Test Results: Test scores are relative to age, gender, and educational history as available and appropriate. Measurement properties of test scores: IQ, Index, and Standard Scores (SS): Mean = 100; Standard Deviation = 15; Scaled Scores (ss): Mean = 10; Standard Deviation = 3; Z scores (Z): Mean = 0; Standard Deviation = 1; T scores (T); Mean = 50; Standard Deviation = 10  Performance Validity: The patient completed one stand-alone performance validity test and two measures with embedded PVTs. Embedded metrics (CVLT FC: 16, RDS: 8) were within normal limits. The stand-alone PVT (DCT) score was marginal with cutoff values validated with use with MCI populations. However, performance aligned with domain specific deficits (visual attention+processing) and overall deficits and behavioral observations/clinical presentation. Overall, cognitive testing is  deemed adequately reliable for interpretation as a valid estimate of the patient's cognitive functioning.   Intellectual/Premorbid Functioning Estimate   Norm Score Percentile  Range  Wechsler Test of Adult Reading  SS = 105 63 %ile Average  Premorbid cognitive abilities are estimated to be within the average range overall based on the patient's history and his performance on a word reading/recognition measure designed for this purpose. ATTENTION AND WORKING MEMORY    Norm Score Percentile  Range  WAIS-IV          Digit Span  ss = 7 16 %ile Low Average   DS Forward  ss = 8 25 %ile Average   Span:    5      DS Backward  ss = 8 25 %ile Average   Span:    3      DS Sequencing  ss = 8 25 %ile Average   Span:    4     WMS-III          Spatial Span  ss = 4 2 %ile Below Average   SS Forward  ss = 5 5 %ile Below Average   Span:    3      SS Backward  ss = 6 9 %ile Low Average   Span:    3     Auditory-verbal attention was scored in the low average range overall.  He performed consistently at the lower limit of the average range on all subtests which included one measure of basic span and two measures with elevated demands on working memory/mental manipulation.  Performance on a visual attention test was scored in the below average range and performances were consistent across subtests.  PROCESSING SPEED    Norm Score Percentile  Range  WAIS-IV  Coding  ss = 8 25 %ile Average   Symbol Search  ss = 7 16 %ile Low Average  Performances on measures of processing speed were consistent and around the average to low average range by age.  LANGUAGE    Norm Score Percentile  Range  Boston Naming Test (BNT-2)  t = 52 58 %ile Average  COWAT          FAS  t = 37 9 %ile Low Average   Animals  t = 41 18 %ile Low Average  Confrontation naming/word retrieval was average by age and education.  Scores on measures of verbal fluency were consistent and within the low average range by age and  education.  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 Behavioral observations were notable for indications of impulsivity (repeatedly starting tasks before instructed), difficulties with frustration tolerance, and social comportment issues. His design careers adviser) fluency performances were average by age with and without set-shifting.   On the speeded sequencing task he scored below average in speed with and without set-shifting. He made 2 errors on both trials.   MEMORY    Norm Score Percentile  Range  BVMT-R          Trial 1 4 t = 44.0 27 %ile Average   Trial 2 5 t = 37 9 %ile Low Average   Trial 3 5 t = 32.0 4 %ile Below Average   Total Recall  t = 36.0 8 %ile Below Average   Learning 1 t = 35.0 7 %ile Below Average   Delayed Recall 4 t = 31.0 3 %ile Below Average   % Retained    80 >16 %ile Low Average   Hits 3    3-5 %ile Below Average   False Alarms 1    6-10 %ile Low to Below Average   Recognition Discriminability 3    1-2 %ile Below to Exceptionally Low  CVLT-III          Trial 1 3 ss = 6.0 9 %ile Low Average   Trial 2 4 ss = 5.0 5 %ile Below Average   Trial 3 2 ss = 2.0 0.4 %ile Exceptionally Low   Trial 4 5 ss = 4.0 2 %ile Below Average   Trial 5 5 ss = 4.0 2 %ile Below Average   Trial B 4 ss = 9.0 37 %ile Average   Short Delay Free Recall 6 ss = 7.0 16 %ile Low Average   Short Delay Cued Recall 6 ss = 6.0 9 %ile Low Average   Long Delay Free Recall 5 ss = 6.0 9 %ile Low Average   Long Delay Cued Recall 5 ss = 5.0 5 %ile Below Average   Total Hits 16 ss = 14.0 91 %ile Above Average   Total False Positives 9 ss = 6.0 9 %ile Low  Average   Recognition Discriminability  ss = 9.0 37 %ile Average   Total Intrusions 13 ss = 6.0 9 %ile Low Average  Trials 1-5 Total Correct  SS = 64 1 %ile Exceptionally Low   Total Repetitions 1 ss = 13.0 84 %ile High Average   List B vs. Trial 1  ss = 10.0 50 %ile Average   SD (FR) vs. Trial 5 Correct  ss = 12.0 75 %ile High Average   LD (FR)vs. SD (FR)  ss = 8.0 25 %ile Average  Wechsler Memory Scale, 4th Edition (WMS-4)         Log. Mem. Immediate Recall  ss = 13 84 %ile High Average   Logical Memory Delayed Recall  ss = 9 37 %ile Average   Logical Recognition    >75th   %ile High Average  The patient completed one visual memory test and two auditory-verbal memory tests.  With the three learning trials of the visual memory test he demonstrated below expected gains from repetition and scored in the below average range for overall information encoded across the learning trials.  His delayed free recall was scored below average for total information reproduced and low average 4% retention.  His memory did not normalize with cueing but was fairly consistent with encoding.  Similar pattern was seen in the and auditory-verbal memory test they utilized a word list.  While his performance on the initial trial was only slightly low his overall immediate recall scores were below expectations and showed minimal gains from repetition.  He scored low average in free and cued recall following a short delay.  Following a long delay his free recall was low average and cued recall was below average.  His recall normalized overall with recognition discriminability scored in the average range on the delayed recognition task.  He showed a slightly elevated intrusion error rate.  His performance was better with the Prose auditory-verbal memory test such that he scored high average for immediate recall.  His delayed free recall was average, albeit a bit lower than expected given his strong immediate recall.  His delayed  recognition performance improved and was more consistent with immediate recall.  VISUAL-SPATIAL    Norm Score Percentile  Range  WAIS-IV          Block Design  ss = 6 9 %ile Low Average  Benton JOLO  ss = 11 63 %ile Average  Rey Complex Figure Copy 20.5      <=1 %ile Exceptionally Low  Clock       WNL  The patient is visual-spatial performances showed some variability with intact basic judgment of line orientation and clock drawing, but visual construction with a complex geometric figure was scored in the exceptionally low score range and construction on a task requiring him to replicate two-dimensional figures using three-dimensional blocks under timed conditions was low average.  PERSONALITY AND BEHAVIORAL FUNCTIONING      Score/Interpretation  BDI Raw       7  BDI Severity       Minimal.  BAI Raw       1  BAI Severity       Minimal.  Self-report endorsements on rating scales of depression and anxiety showed no clinically significant elevations.   SUMMARY / CLINICAL IMPRESSIONS The patient is a 67 year old man with history of multiple concussions referred by his primary care provider, Tylene Meres, PA-C, for neuropsychological evaluation due to memory difficulties. Per records, the patient was seen for memory concerns and difficulties hearing and processing on 03/01/24 by the referring provider. Visit notes indicated difficulty remembering conversations, forgetfulness when driving, and possible  difficulties with auditory-verbal processing. The patient wears hearing aids. Family is reportedly concerned as well. The patient has no known family history of dementia of Alzheimer's. No difficulties with sleep or stroke like symptoms were reported. The patient was referred to neurology and neuropsychology. The patient was seen by neurologist Dr. Dannielle on 04/12/24. Per visit notes, cognitive difficulties involve memory loss and difficulty with word recall that the patient felt began in 2015 when he  sustained a concussion.  Records indicate the patient was accompanied by his spouse, who felt symptoms have been progressively worsening since he stopped working approximately 8 years ago due to knee injury.  Records report that the patient had initial recovery following the concussion.  Records note the patient endorsed difficulties recalling directions while driving but no other problems with driving.  Records also indicated that the patient had a history of three concussions.  The first occurred during childhood when a hammer fell from a height of around 25 feet and struck him in the head, the next involved impact while playing football, and the last occurred in the workplace.  Some difficulties with hearing loss were also reported.  Records noted family history of ALS and cancer. Mental status at that visit was unremarkable. Assessment and plan section of that visit note indicated plan for comprehensive workup including MRI and lab work. 05/31/2024 MRI of the head without contrast was completed. Per reported results, moderate generalized volume loss without particular lobar predominance and mild chronic small vessel disease was indicated.   Upon interview, the patient felt cognitive symptoms began 10-11 years prior following a suspected mild TBI, but with worsening in recent years. His spouse reported noticing significant changes in the last 12-18 months and a progressive course. In terms of subjective cognitive complaints, the patient's primary areas of difficulty involved processing speed, attention/concentration, and mild changes in executive functioning. His spouse reported noticing significant changes in social behavior and indications of reduced empathy. Changes in behavior involved both reduced overall activity (increasingly sedentary) but intermittent perseverative/obsessive behavior that appears difficult for him to control, although insight/awareness may be contributory. His wife noted significant  changes and challenges in communication. Significant variability in attention/concentration is also present, which may be contributory. Changes in instrumental activities of daily living are relatively minor. No significant psychiatric problems were identified. However, behavioral changes involve increased restlessness/pacing, and possible increase in consumption of sugary foods. The patient has had a number of concussions. Alcohol consumption is fairly high, aligning more with a binge-type pattern of heavy drinking on the weekend which has been going on for 7-8 years.  Behavioral observations from intake and feedback visits with the provider were notable for slightly atypical social/interpersonal interactions for the setting. Behavioral observations during testing were more significant and included signs of disinhibition / impulsivity and social comportment concerns. Cognitive test data showed relatively modest but consistent indications suspicious for cognitive decline relative to the patient's baseline. Measures of attention showed deficits in visual attention, and auditory-verbal attentional measures were consistently at the lower limit of the average range. Attentional capacity/span with instructed information seemed consistent and low based on combination attentional and immediate recall performances on memory tests. Processing speed was slightly lower than expected, and most speed-based performances across testing showed similar findings qualitatively. 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 Language functioning was intact with naming, but verbal fluency was  mildly impaired for both phonemic and semantic fluency. Visual-spatial tasks showed deficits with more complex tasks. Memory testing showed below expected performances with  visual stimuli and unstructured verbal stimuli (word-list). Performance was best with structured verbal stimuli (short story). The patient's memory profile showed fairly clear indications of dysexecutive memory deficits (difficulty with encoding, weaker free recall, and notable improvement on recognition tasks).   Formal neurocognitive disorder diagnosis is warranted given indications of cognitive decline. Modest severity of deficits on testing and limited declines in instrumental activities of daily living align more with a DSM-5-TR mild neurocognitive disorder, although functional impact of social-interpersonal/personality changes and other behavioral changes align more with Major neurocognitive disorder (Dementia, per ICD-10). A conservative mild neurocognitive disorder diagnosis is made, although a mild severity major neurocognitive disorder would be appropriate if subsequent work-up yielded greater etiological certainty.   The combination of history, behavioral presentation/observations, and cognitive profile is concerning for possible frontotemporal etiology. Social and behavioral changes which deviate from the patient's baseline are notable. The relatively modest deficits seen in testing is not incongruent with behavioral changes given that neurological/structural changes underlying behavioral symptoms are ones which neurocognitive tests lack sensitivity. However, not evidence of more pronounced atrophy in the relevant regions of the brain were reported in MRI imaging results. Metabolic PET may potentially provide greater certainty for FTD differential. I defer to neurology regarding benefit for subsequent medical testing. Alternative etiological considerations involve cerebrovascular disease / alcohol induced neurocognitive changes. Behavioral changes are more than typically expected for these etiologies, but the patient's cognitive profile could be accounted for with this etiology. Descriptions by the  patient's wife of observing significant variability in attention/concentration raised some concern for fluctuations in mental status, but other hallmark indications of LBD are not evident. The cognitive profile is not consistent with an Alzheimer's dementia. A re-evaluation and possible additional work-up may be beneficial for clarification of etiology. I am concerned that there is a significant risk of progressive cognitive decline, and close monitoring is recommended.   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   Recommendations:  Follow-up with the referring provider as planned. Additional workup may be beneficial for differential. I defer to neurology's determinations.  A repeat neuropsychological evaluation in 12 months (or sooner if functional decline is noted) is recommended. In addition to managing any chronic health conditions, particularly cardiovascular health, abstaining from alcohol use is recommended given the potential negative impact of heavy intermittent consumption on neurological/brain health.   Independent Functioning:  Currently, the patient appears to be functioning with fairly limited difficulty with instrumental activities of daily living. Given variability in attention/concentration and changes in short term memory functioning seen in testing, use of compensatory strategies may be beneficial.  Avoid multitasking when completing important tasks or being told information you want to remember later.  Using a small notepad to write things down 'on-the-fly' can be helpful for later retention and reminders.  Check-lists or task-lists can be helpful for reducing the impact of distractibility, helping when re-focusing / returning to a task  when side-tracked.  Reduce environmental distractions when possible if needing to complete a cognitively challenging task.   Medication organizers/pillboxes, alarms/reminders, utilizing routine, and using a planner/calendar can also be helpful.  If needed: Use of collaborative systems (between patient and family/friends) can be beneficial in daily activities if concerns emerge for activities in which errors  impact health and safety. Collaborative systems involve working together such that the patient manages daily activities when able to do so reliably and safely, and support is provided for those activities with which they have difficulty. The approach promotes independence and remaining active, while adjusting for safety concerns as needed. An example of a collaborative system involving medication; Patient fills in weekly pill organizer and loved one checks it over for any mistakes. The patient continues taking medication independently. If errors begin to appear in the weekly check, the loved one can take over the weekly set-up, while the patient can continue taking medications on their own as long as able to do so reliably.   Lifestyle and Behavioral recommendations: Multi-domain lifestyle and behavioral recommendations have found to be beneficial for individuals with neurocognitive disorders. Benefits involve promoting cognitive and physical health, and independent functioning. Interventions are most effective early in disease course, but can be beneficial even later on.  Staying physically active ideally with physical exercise (but only if appropriate/safe and with physician supervision and approval) can be beneficial for quality of life, physical functioning, and cardiovascular health.  Cardiovascular health and management of other chronic medical conditions is recommended. Examples include high cholesterol, hypertension, diabetes, obstructive sleep apnea etc. Management is important overall but even  more so for individuals with cerebrovascular disease or other cardiovascular health conditions. Follow your doctors recommendations, take medication as prescribed, eat healthy, exercise, etc.  Dietary interventions, particularly the Mediterranean or MIND diet emphasizing green leafy vegetables, nuts, berries, and fish, are recommended. An internet search for MIND diet will provide many resources with additional information if interested.  Cognitive stimulation/stay active! Cognitive stimulation is also beneficial for cognitive health and reducing risk of cognitive decline. Cognitively stimulating activities are at their core activities which require your active participation, concentration, and cognitive engagement. Cognitive stimulation can take many forms. Reading (as you are doing now), puzzles, and even some activities of daily living can apply. It doesn't need to be a formal exercise. Activities can be, and ideally are, things you enjoy. Find what works for you!   Driving:  Cognitive testing does not directly measure driving ability, but some areas of difficulty seen on testing (attention and executive functioning) can correlate with driving abilities. If the patient or family have any concerns of driving safety, a formal driving evaluation may be beneficial to ensure safety for the patient and others. Organizations / locations for undergoing driving evaluation: [employer] in [place]: [phone], -[facility]: [phone], -[facility]: [phone], [facility]: [phone] or [phone].  Please don't hesitate to reach out to me if you have any questions or concerns that emerge upon review of this report!    Evalene DOROTHA Riff, PsyD [facility] [address] [place], [place] [address] Main: [phone] Fax: [phone] [place] # [phone]  This report was generated using voice recognition software. While  this document has been carefully reviewed, transcription errors may be present. I apologize in advance for any inconvenience. Please contact me if further clarification is needed.   apologize in advance for any inconvenience. Please contact me if further clarification is needed.  "

## 2024-08-07 NOTE — Progress Notes (Signed)
" ° °  NEUROPSYCHOLOGICAL EVALUATION [facility]  Physical Medicine and Rehabilitation     Patient: Kenneth Snyder  MRN: [id number] DOB: [birth year]   Service Provider/Clinical Neuropsychologist: Evalene DOROTHA Riff, PsyD  Date of Service: 07/07/24 Start Time: 2 PM End Time: 3 PM  Location of Service:  [facility] Physical Medicine & Rehabilitation Department [place]. [facility] [address] [place], [place] [address] Phone: [phone]   Billing Code/Service: [phone]    Individuals present: Patient, spouse, Provider Laurier DOROTHA Riff, PsyD)  Provider conducted the 60-minute interactive feedback appointment in-person with the patient and his spouse.  The provider reviewed and discussed the results of neuropsychological evaluation. Follow-up interviewing was conducted as needed to refine interpretation of findings as needed. Review of results included overall findings, diagnosis, and treatment planning/recommendations that were derived from integration of patient data, interpretation of standardized rest results and clinical data, and clinical decision making, which are documented in the patient's electronic medical record with the full report (date listed below). A copy of the full report will also be mailed to the patient.   The patient expressed understanding of the information reviewed. The patient was provided opportunity to ask questions which were then answered by the provider. The provider worked collaboratively to tailor treatment recommendations to the patient when possible. The patient was informed they could reach out to the provider should additional questions related to the evaluation arise.    The final neuropsychological evaluation report, documented in the patient's chart DATE 07/07/24, was amended to reflect any additional information obtained during the feedback appointment including treatment planning collaboration.    Evalene DOROTHA Riff,  PsyD [facility] [address] [place], [place] [address] Main: [phone] Fax: [phone] [place] License # [id number]  This report was generated using voice recognition software. While this document has been carefully reviewed, transcription errors may be present. I apologize in advance for any inconvenience. Please contact me if further clarification is needed.  "
